# Patient Record
Sex: Female | Born: 1945 | Race: White | Hispanic: No | Marital: Married | Smoking: Never smoker
Health system: Southern US, Community
[De-identification: ages and names within clinical notes are randomized; demographics above are authoritative.]

## PROBLEM LIST (undated history)

## (undated) DIAGNOSIS — E119 Type 2 diabetes mellitus without complications: Secondary | ICD-10-CM

## (undated) DIAGNOSIS — E039 Hypothyroidism, unspecified: Secondary | ICD-10-CM

## (undated) DIAGNOSIS — I10 Essential (primary) hypertension: Secondary | ICD-10-CM

## (undated) HISTORY — PX: BREAST BIOPSY: SHX20

## (undated) HISTORY — DX: Essential (primary) hypertension: I10

## (undated) HISTORY — DX: Type 2 diabetes mellitus without complications: E11.9

## (undated) HISTORY — DX: Hypothyroidism, unspecified: E03.9

---

## 2002-07-22 ENCOUNTER — Ambulatory Visit (HOSPITAL_COMMUNITY): Admission: RE | Admit: 2002-07-22 | Discharge: 2002-07-22 | Payer: Self-pay | Admitting: Obstetrics and Gynecology

## 2002-07-22 ENCOUNTER — Encounter: Payer: Self-pay | Admitting: Obstetrics and Gynecology

## 2002-08-05 ENCOUNTER — Encounter: Payer: Self-pay | Admitting: Obstetrics and Gynecology

## 2002-08-05 ENCOUNTER — Ambulatory Visit (HOSPITAL_COMMUNITY): Admission: RE | Admit: 2002-08-05 | Discharge: 2002-08-05 | Payer: Self-pay | Admitting: Obstetrics and Gynecology

## 2003-04-16 ENCOUNTER — Encounter: Payer: Self-pay | Admitting: Family Medicine

## 2003-04-16 ENCOUNTER — Ambulatory Visit (HOSPITAL_COMMUNITY): Admission: RE | Admit: 2003-04-16 | Discharge: 2003-04-16 | Payer: Self-pay | Admitting: Family Medicine

## 2003-09-21 ENCOUNTER — Ambulatory Visit (HOSPITAL_COMMUNITY): Admission: RE | Admit: 2003-09-21 | Discharge: 2003-09-21 | Payer: Self-pay | Admitting: Family Medicine

## 2004-09-29 ENCOUNTER — Ambulatory Visit (HOSPITAL_COMMUNITY): Admission: RE | Admit: 2004-09-29 | Discharge: 2004-09-29 | Payer: Self-pay | Admitting: Family Medicine

## 2005-10-02 ENCOUNTER — Ambulatory Visit (HOSPITAL_COMMUNITY): Admission: RE | Admit: 2005-10-02 | Discharge: 2005-10-02 | Payer: Self-pay | Admitting: *Deleted

## 2006-09-05 ENCOUNTER — Ambulatory Visit (HOSPITAL_COMMUNITY): Payer: Self-pay | Admitting: Psychology

## 2006-10-24 ENCOUNTER — Ambulatory Visit (HOSPITAL_COMMUNITY): Admission: RE | Admit: 2006-10-24 | Discharge: 2006-10-24 | Payer: Self-pay | Admitting: Unknown Physician Specialty

## 2007-04-22 ENCOUNTER — Ambulatory Visit (HOSPITAL_COMMUNITY): Admission: RE | Admit: 2007-04-22 | Discharge: 2007-04-22 | Payer: Self-pay | Admitting: Unknown Physician Specialty

## 2007-11-11 ENCOUNTER — Ambulatory Visit (HOSPITAL_COMMUNITY): Admission: RE | Admit: 2007-11-11 | Discharge: 2007-11-11 | Payer: Self-pay | Admitting: Family Medicine

## 2008-02-18 ENCOUNTER — Other Ambulatory Visit: Admission: RE | Admit: 2008-02-18 | Discharge: 2008-02-18 | Payer: Self-pay | Admitting: Gynecology

## 2009-04-06 ENCOUNTER — Ambulatory Visit (HOSPITAL_COMMUNITY): Admission: RE | Admit: 2009-04-06 | Discharge: 2009-04-06 | Payer: Self-pay | Admitting: Family Medicine

## 2010-07-11 ENCOUNTER — Encounter: Payer: Self-pay | Admitting: Cardiology

## 2010-08-04 ENCOUNTER — Ambulatory Visit: Payer: Self-pay | Admitting: Cardiology

## 2010-08-04 DIAGNOSIS — E782 Mixed hyperlipidemia: Secondary | ICD-10-CM | POA: Insufficient documentation

## 2010-08-04 DIAGNOSIS — R0602 Shortness of breath: Secondary | ICD-10-CM | POA: Insufficient documentation

## 2010-08-04 DIAGNOSIS — E785 Hyperlipidemia, unspecified: Secondary | ICD-10-CM | POA: Insufficient documentation

## 2010-08-23 ENCOUNTER — Ambulatory Visit: Payer: Self-pay

## 2010-08-23 ENCOUNTER — Ambulatory Visit: Payer: Self-pay | Admitting: Cardiology

## 2010-08-23 ENCOUNTER — Ambulatory Visit: Payer: Self-pay | Admitting: Cardiovascular Disease

## 2010-08-23 ENCOUNTER — Ambulatory Visit (HOSPITAL_COMMUNITY): Admission: RE | Admit: 2010-08-23 | Discharge: 2010-08-23 | Payer: Self-pay | Admitting: Cardiology

## 2010-08-23 ENCOUNTER — Encounter: Payer: Self-pay | Admitting: Cardiology

## 2010-09-08 ENCOUNTER — Telehealth: Payer: Self-pay | Admitting: Cardiology

## 2010-09-21 ENCOUNTER — Ambulatory Visit: Payer: Self-pay | Admitting: Cardiology

## 2010-09-28 ENCOUNTER — Telehealth: Payer: Self-pay | Admitting: Cardiology

## 2010-09-28 LAB — CONVERTED CEMR LAB
BUN: 22 mg/dL (ref 6–23)
CO2: 28 meq/L (ref 19–32)
Chloride: 103 meq/L (ref 96–112)
Glucose, Bld: 117 mg/dL — ABNORMAL HIGH (ref 70–99)
Potassium: 4.1 meq/L (ref 3.5–5.3)

## 2010-10-11 ENCOUNTER — Ambulatory Visit: Payer: Self-pay | Admitting: Cardiology

## 2010-10-18 ENCOUNTER — Ambulatory Visit: Payer: Self-pay

## 2010-11-29 NOTE — Progress Notes (Signed)
Summary: B/P readings--09/28/10  Phone Note Outgoing Call   Call placed by: Katina Dung, RN, BSN,  September 28, 2010 1:11 PM Call placed to: Patient Summary of Call: B/P readings  Follow-up for Phone Call        I called pt to get B/P readings---B/P readings 120/70, 131/65, 137/72, 143/71, 141/70 --pt states she feels B/P is better --overall she feels good--I will forward to Dr Shirlee Latch for review     Appended Document: B/P readings--09/28/10 BP good  Appended Document: B/P readings--09/28/10 LMTCB   Appended Document: B/P readings--09/28/10 discussed with pt by telephone

## 2010-11-29 NOTE — Letter (Signed)
Summary: [facility]  [facility]   Imported By: Marylou Mccoy 08/17/2010 13:16:13  _____________________________________________________________________  External Attachment:    Type:   Image     Comment:   External Document

## 2010-11-29 NOTE — Progress Notes (Signed)
Summary: B/P readings  Phone Note Outgoing Call   Call placed by: Katina Dung, RN, BSN,  September 08, 2010 11:54 AM Call placed to: Patient Summary of Call: call and get B/P readings  Follow-up for Phone Call        talked with pt about recent B/P readings---08/28/10 150/85   08/29/10 164/89   08/31/10 154/85  147/78  09/01/10 163/87   09/02/10 154/87    09/03/10 153/84  153/79  09/05/10  164/94  148/84  pt without complaints--she has lost 5 pounds since OV 08/23/10--I will forward to Dr Shirlee Latch for review           Appended Document: B/P readings BP is too high.  Chlorthalidone 12.5 mg daily + KCl 10 mEq daily with followup BMET and BP check in 2 wks.   Appended Document: B/P readings LMTCB   Appended Document: B/P readings I talked with pt-she verbalized understanding and will return for [facility] 09/21/10   Clinical Lists Changes  Medications: Added new medication of CHLORTHALIDONE 25 MG TABS (CHLORTHALIDONE) one half tablet daily - Signed Added new medication of KLOR-CON M10 10 MEQ CR-TABS (POTASSIUM CHLORIDE CRYS CR) one daily - Signed Rx of CHLORTHALIDONE 25 MG TABS (CHLORTHALIDONE) one half tablet daily;  #15 x 6;  Signed;  Entered by: Katina Dung, RN, BSN;  Authorized by: Marca Ancona, MD;  Method used: Electronically to [facility]  [id number] [address] [place], [place], [place]  [address], Ph: [phone] or [phone], Fax: [phone] Rx of KLOR-CON M10 10 MEQ CR-TABS (POTASSIUM CHLORIDE CRYS CR) one daily;  #30 x 6;  Signed;  Entered by: Katina Dung, RN, BSN;  Authorized by: Marca Ancona, MD;  Method used: Electronically to [facility]  [address] [address] [place], [place], [place]  [address], Ph: [phone] or [phone], Fax: [phone] Observations: Added new observation of MEDRECON: current updated (09/08/2010 13:32)    Prescriptions: KLOR-CON M10 10 MEQ CR-TABS (POTASSIUM CHLORIDE CRYS CR) one daily  #30 x 6   Entered by:    Katina Dung, RN, BSN   Authorized by:   Marca Ancona, MD   Signed by:   Katina Dung, RN, BSN on 09/08/2010   Method used:   [facility]ically to        [facility]  [address] [facility] (retail)       [address]       [place], [place]  [address]       Ph: [phone] or [phone]       Fax: [phone]   RxID:   [id number] CHLORTHALIDONE 25 MG TABS (CHLORTHALIDONE) one half tablet daily  #15 x 6   Entered by:   Katina Dung, RN, BSN   Authorized by:   Marca Ancona, MD   Signed by:   Katina Dung, RN, BSN on 09/08/2010   Method used:   [facility]        [facility]  [address] [id number] (retail[facility]       [address]       [place], [place]  [address]       Ph: [phone] or [phone]       Fax: [phone]   RxID:   [id number]     Current Medications (verified): 1)  Synthroid 50 Mcg Tabs (Levothyroxine Sodium) .... Once Daily 2)  Cymbalta 60 Mg Cpep (Duloxetine Hcl) .... Once Daily, Pt Unsure of Dose 3)  Lipitor 20 Mg Tabs (Atorvastatin Calcium) .... One Daily 4)  Naproxen Sodium 220 Mg Tabs (Naproxen Sodium) .... Once Daily, Dose Unkown 5)  Prilosec 20 Mg Cpdr (Omeprazole) .... Once Daily 6)  Chlorthalidone 25 Mg Tabs (Chlorthalidone) .... One Half Tablet Daily 7)  Klor-Con M10 10 Meq Cr-Tabs (Potassium Chloride Crys Cr) .... One Daily  Allergies: No Known Drug Allergies

## 2010-11-29 NOTE — Assessment & Plan Note (Signed)
Summary: np6/ chf / doe/ left extermites edema/ hypercholestemia/ bcbs...   Visit Type:  new pt Primary Provider:  Dr Freddrick March. Ross  CC:  tired all the time and swelling.  History of Present Illness: 65 yo with history of hyperlipidemia, depression, and subclinical hypothyroidism presents for evaluation of fatigue and dyspnea.  Patient reports generalized lack of energy and a profound sense of fatigue.  She does not want to get out to do anything and feels exerting herself is "not worth the effort."  She is especially affected by the heat.  She thinks that she has gained 40 lbs in a year.  She says that her thyroid was recently checked (on replacement) and that the indices were "ok."  Her mood is depressed and she says that Cymbalta has not helped much.   Patient additionally reports shortness of breath when carrying a load of laundary up steps or other more heavy forms of exertion.  This has been present for a couple of years but seems to be getting a bit worse.  She can, however, walk on flat ground without significant difficulty for up to 2 miles.  No chest pain or tightness.  She is not a smoker.  She is on pravastatin for lipids but LDL is still quite high.    ECG: NSR, normal  Labs (9/11): HCT 37.2, K 4.9, creatinine 0.84, LDL 169, HDL 60  Current Medications (verified): 1)  Synthroid 50 Mcg Tabs (Levothyroxine Sodium) .... Once Daily 2)  Cymbalta 60 Mg Cpep (Duloxetine Hcl) .... Once Daily, Pt Unsure of Dose 3)  Pravastatin Sodium 80 Mg Tabs (Pravastatin Sodium) .... Once Daily 4)  Naproxen Sodium 220 Mg Tabs (Naproxen Sodium) .... Once Daily, Dose Unkown 5)  Prilosec 20 Mg Cpdr (Omeprazole) .... Once Daily  Allergies (verified): No Known Drug Allergies  Past History:  Past Medical History: 1. hypothyroidism-subclinical 2. Depression 3. Hypercholesterolemia 4. GERD  Family History: Father with CHF at 52 in setting of metastatic cancer Grandmother on digitalis, unsure  why Siblings without significant cardiac disease  Social History: Nonsmoker.  Rare ETOH.   Married, not working Originally from [place], moved here for [employer] work  Review of Systems       All systems reviewed and negative except as per HPI.   Vital Signs:  Patient profile:   65 year old female Height:      64 inches Weight:      182.75 pounds BMI:     31.48 Pulse rate:   83 / minute BP sitting:   120 / 70  (left arm) Cuff size:   regular  Vitals Entered By: Caralee Ates CMA (August 04, 2010 10:41 AM)  Physical Exam  General:  Well developed, well nourished, in no acute distress. Mildly obese. Head:  normocephalic and atraumatic Nose:  no deformity, discharge, inflammation, or lesions Mouth:  Teeth, gums and palate normal. Oral mucosa normal. Neck:  Neck supple, no JVD. No masses, thyromegaly or abnormal cervical nodes. Lungs:  Clear bilaterally to auscultation and percussion. Heart:  Non-displaced PMI, chest non-tender; regular rate and rhythm, S1, S2 without murmurs, rubs or gallops. Carotid upstroke normal, no bruit. Pedals normal pulses. No edema, no varicosities. Abdomen:  Bowel sounds positive; abdomen soft and non-tender without masses, organomegaly, or hernias noted. No hepatosplenomegaly. Msk:  Back normal, normal gait. Muscle strength and tone normal. Extremities:  No clubbing or cyanosis. Neurologic:  Alert and oriented x 3. Skin:  Intact without lesions or rashes. Psych:  depressed affect.     Impression & Recommendations:  Problem # 1:  SHORTNESS OF BREATH (ICD-786.05) Patient gets short of breath with moderate to heavy exertion.  She feels like this has worsened over the last year. No chest pain or tightness.  She does have profound fatigue with some mild anhedonia and depressed mood.  Main risk factor for coronary disease is hyperlipidemia.  I suspect that the main causes of her symptoms are weight gain over the last year with some resulting decreased  exercise tolerance and depression.  I will do a basic evaluation to make sure that her heart is structurally normal.   - Normal baseline ECG, arrange for ETT.  - Echocardiogram to ensure heart is structurally normal - Cymbalta does not seem to be helping her depression, ? med change or referral to psychiatrist (per primary).   Problem # 2:  HYPERLIPIDEMIA-MIXED (ICD-272.4) Good HDL but LDL is still quite elevated at 160.  Patient states that she was taking pravastatin 80 mg daily when this lab was drawn.  Will change her over to atorvastatin 20 mg daily.  This will be generic in a couple of months. lipids/LFTs in 2 months with change.   Other Orders: Treadmill (Treadmill) Echocardiogram (Echo)  Patient Instructions: 1)  Your physician has recommended you make the following change in your medication:  2)  Stop Pravastatin.   3)  Start Lipitor 20mg  daily. 4)  Your physician has requested that you have an echocardiogram.  Echocardiography is a painless test that uses sound waves to create images of your heart. It provides your doctor with information about the size and shape of your heart and how well your heart's chambers and valves are working.  This procedure takes approximately one hour. There are no restrictions for this procedure.  YOU CAN HAVE THIS THE SAME DAY AS THE TREADMILL BUT BE SURE YOU HAVE THE ECHO FIRST SO DR MCLEAN WILL HAVE THESE RESULTS BEFORE YOU HAVE THE TREADMILL. 5)  Your physician has requested that you have an exercise tolerance test.  For further information please visit [email or web address]  Please also follow instruction sheet, as given. 6)  Your physician recommends that you return for a FASTING lipid profile/liver profile in 2 months--786.09 Prescriptions: LIPITOR 20 MG TABS (ATORVASTATIN CALCIUM) one daily  #30 x 3   Entered by:   Katina Dung, RN, BSN   Authorized by:   Marca Ancona, MD   Signed by:   Katina Dung, RN, BSN on 08/04/2010   Method used:    [facility]  [address] [id number]       [address]       [place], [place]  [address]       Ph: [phone] or [phone]       Fax: [phone]   RxID:   [id number]

## 2013-09-16 ENCOUNTER — Encounter (INDEPENDENT_AMBULATORY_CARE_PROVIDER_SITE_OTHER): Payer: Self-pay | Admitting: *Deleted

## 2014-03-04 ENCOUNTER — Encounter (INDEPENDENT_AMBULATORY_CARE_PROVIDER_SITE_OTHER): Payer: Self-pay | Admitting: *Deleted

## 2014-09-22 ENCOUNTER — Encounter (INDEPENDENT_AMBULATORY_CARE_PROVIDER_SITE_OTHER): Payer: Self-pay | Admitting: *Deleted

## 2014-10-14 ENCOUNTER — Ambulatory Visit (INDEPENDENT_AMBULATORY_CARE_PROVIDER_SITE_OTHER): Payer: Self-pay | Admitting: Internal Medicine

## 2014-11-04 ENCOUNTER — Encounter (INDEPENDENT_AMBULATORY_CARE_PROVIDER_SITE_OTHER): Payer: Self-pay | Admitting: Internal Medicine

## 2014-11-04 ENCOUNTER — Telehealth (INDEPENDENT_AMBULATORY_CARE_PROVIDER_SITE_OTHER): Payer: Self-pay | Admitting: *Deleted

## 2014-11-04 ENCOUNTER — Other Ambulatory Visit (INDEPENDENT_AMBULATORY_CARE_PROVIDER_SITE_OTHER): Payer: Self-pay | Admitting: *Deleted

## 2014-11-04 ENCOUNTER — Ambulatory Visit (INDEPENDENT_AMBULATORY_CARE_PROVIDER_SITE_OTHER): Payer: Managed Care, Other (non HMO) | Admitting: Internal Medicine

## 2014-11-04 VITALS — BP 118/72 | HR 76 | Temp 98.1°F | Ht 64.0 in | Wt 137.4 lb

## 2014-11-04 DIAGNOSIS — D649 Anemia, unspecified: Secondary | ICD-10-CM

## 2014-11-04 DIAGNOSIS — R195 Other fecal abnormalities: Secondary | ICD-10-CM

## 2014-11-04 DIAGNOSIS — Z1211 Encounter for screening for malignant neoplasm of colon: Secondary | ICD-10-CM

## 2014-11-04 DIAGNOSIS — D509 Iron deficiency anemia, unspecified: Secondary | ICD-10-CM

## 2014-11-04 LAB — IRON AND TIBC
%SAT: 10 % — ABNORMAL LOW (ref 20–55)
IRON: 50 ug/dL (ref 42–145)
TIBC: 508 ug/dL — AB (ref 250–470)
UIBC: 458 ug/dL — ABNORMAL HIGH (ref 125–400)

## 2014-11-04 LAB — FERRITIN: FERRITIN: 8 ng/mL — AB (ref 10–291)

## 2014-11-04 LAB — VITAMIN B12: Vitamin B-12: 599 pg/mL (ref 211–911)

## 2014-11-04 LAB — FOLATE: Folate: 16 ng/mL

## 2014-11-04 NOTE — Patient Instructions (Signed)
Colonoscopy.  The risks and benefits such as perforation, bleeding, and infection were reviewed with the patient and is agreeable. 

## 2014-11-04 NOTE — Telephone Encounter (Signed)
Patient needs movi prep 

## 2014-11-04 NOTE — Progress Notes (Addendum)
Subjective:    Patient ID: Carla Nguyen, female    DOB: 03/23/1946, 69 y.o.   MRN: [id number]  HPI Referred to our office by Dr Loney Hering for +FOB card. She tells me she is due for a colonoscopy. Last colonoscopy was by Dr. Cleotis Nipper in 2006 and was normal. There were no polyps.    Recent blood work noted Hemoglobin was 10.7.  She tells me she donates blood and platelets quarterly. Last donation was in September. Appetite is good. She has lost about 30 pounds over a year. The weight loss was intentional due to new diagnosis of DM2. No dysphagia. No abdominal pain. She usually has a BM about once every day.  She denies seeing any melena or BRRB. 09/07/2014 H and H 10.7 and 34.2, MCV 81.3, platelet ct 452, Albumin 4.1, ALP 62, AST 24, total bili 0.3. ALT 18.  11/17/2004 Colonoscopy Dr. Cleotis Nipper: External inspection of the anus revealed a small hemorrhoidal tag. Digital exam of the rectum revealed no rectal mass, no blood on the glove. The colonoscope passed all the way to the cecum. No polyps or other neoplasm seen. No inflammation, no diverticular disease. Patient had some small internal hemorrhoids.  Review of Systems Past Medical History  Diagnosis Date  . Hypertension   . Diabetes     x 1 yr  . Hypothyroid     Past Surgical History  Procedure Laterality Date  . Breast biopsy      rt normal    No Known Allergies  No current outpatient prescriptions on file prior to visit.   No current facility-administered medications on file prior to visit.    Past Medical History  Diagnosis Date  . Hypertension   . Diabetes     x 1 yr  . Hypothyroid     Past Surgical History  Procedure Laterality Date  . Breast biopsy      rt normal   Current Outpatient Prescriptions  Medication Sig Dispense Refill  . amLODipine (NORVASC) 5 MG tablet Take 5 mg by mouth daily.    Marland Kitchen atorvastatin (LIPITOR) 40 MG tablet Take 40 mg by mouth daily.    . chlorthalidone (HYGROTON) 25 MG tablet Take 25  mg by mouth daily.    . DULoxetine (CYMBALTA) 60 MG capsule Take 60 mg by mouth daily.    Marland Kitchen levothyroxine (SYNTHROID, LEVOTHROID) 50 MCG tablet Take 50 mcg by mouth daily before breakfast.    . metFORMIN (GLUCOPHAGE) 1000 MG tablet Take 1,000 mg by mouth daily with breakfast.     No current facility-administered medications for this visit.     No Known Allergies  No current outpatient prescriptions on file prior to visit.   No current facility-administered medications on file prior to visit.   No current outpatient prescriptions on file prior to visit.   No current facility-administered medications on file prior to visit.   .med      Objective:   Physical Exam  Filed Vitals:   11/04/14 1117  Height:  (1.626 m)  Weight: 137 lb 6.4 oz (62.324 kg)   Alert and oriented. Skin warm and dry. Oral mucosa is moist.   . Sclera anicteric, conjunctivae is pink. Thyroid not enlarged. No cervical lymphadenopathy. Lungs clear. Heart regular rate and rhythm.  Abdomen is soft. Bowel sounds are positive. No hepatomegaly. No abdominal masses felt. No tenderness.  No edema to lower extremities.           Assessment & Plan:  Anemia. +  FOB card. Colonic carcinoma needs to be ruled out. Colonoscopy.The risks and benefits such as perforation, bleeding, and infection were reviewed with the patient and is agreeable. [facility] Will also get an anemia profile.

## 2014-11-06 ENCOUNTER — Encounter (INDEPENDENT_AMBULATORY_CARE_PROVIDER_SITE_OTHER): Payer: Self-pay

## 2014-11-06 MED ORDER — PEG-KCL-NACL-NASULF-NA ASC-C 100 G PO SOLR: 1.0000 | Freq: Once | ORAL | Status: DC

## 2014-11-13 ENCOUNTER — Encounter (HOSPITAL_COMMUNITY)
Admission: RE | Disposition: A | Discharge status: Home / Self Care | Payer: Self-pay | Source: Ambulatory Visit | Attending: Internal Medicine

## 2014-11-13 ENCOUNTER — Encounter (HOSPITAL_COMMUNITY): Payer: Self-pay | Admitting: *Deleted

## 2014-11-13 ENCOUNTER — Ambulatory Visit (HOSPITAL_COMMUNITY)
Admission: RE | Admit: 2014-11-13 | Discharge: 2014-11-13 | Disposition: A | Discharge status: Home / Self Care | Payer: Managed Care, Other (non HMO) | Source: Ambulatory Visit | Attending: Internal Medicine | Admitting: Internal Medicine

## 2014-11-13 DIAGNOSIS — E039 Hypothyroidism, unspecified: Secondary | ICD-10-CM | POA: Diagnosis not present

## 2014-11-13 DIAGNOSIS — R040 Epistaxis: Secondary | ICD-10-CM | POA: Diagnosis not present

## 2014-11-13 DIAGNOSIS — K5730 Diverticulosis of large intestine without perforation or abscess without bleeding: Secondary | ICD-10-CM | POA: Insufficient documentation

## 2014-11-13 DIAGNOSIS — R195 Other fecal abnormalities: Secondary | ICD-10-CM

## 2014-11-13 DIAGNOSIS — I10 Essential (primary) hypertension: Secondary | ICD-10-CM | POA: Insufficient documentation

## 2014-11-13 DIAGNOSIS — D649 Anemia, unspecified: Secondary | ICD-10-CM | POA: Diagnosis not present

## 2014-11-13 DIAGNOSIS — K921 Melena: Secondary | ICD-10-CM | POA: Diagnosis present

## 2014-11-13 DIAGNOSIS — K644 Residual hemorrhoidal skin tags: Secondary | ICD-10-CM | POA: Insufficient documentation

## 2014-11-13 DIAGNOSIS — Q438 Other specified congenital malformations of intestine: Secondary | ICD-10-CM

## 2014-11-13 DIAGNOSIS — E119 Type 2 diabetes mellitus without complications: Secondary | ICD-10-CM | POA: Insufficient documentation

## 2014-11-13 HISTORY — PX: COLONOSCOPY: SHX5424

## 2014-11-13 LAB — GLUCOSE, CAPILLARY
GLUCOSE-CAPILLARY: 60 mg/dL — AB (ref 70–99)
Glucose-Capillary: 74 mg/dL (ref 70–99)
Glucose-Capillary: 81 mg/dL (ref 70–99)

## 2014-11-13 SURGERY — COLONOSCOPY
Anesthesia: Moderate Sedation

## 2014-11-13 MED ORDER — STERILE WATER FOR IRRIGATION IR SOLN
Status: DC | PRN
  Administered 2014-11-13: 10:00:00

## 2014-11-13 MED ORDER — MEPERIDINE HCL 50 MG/ML IJ SOLN
INTRAMUSCULAR | Status: DC | PRN
  Administered 2014-11-13 (×2): 25 mg

## 2014-11-13 MED ORDER — SODIUM CHLORIDE 0.9 % IV SOLN
INTRAVENOUS | Status: DC
  Administered 2014-11-13: 09:00:00 via INTRAVENOUS

## 2014-11-13 MED ORDER — MIDAZOLAM HCL 5 MG/5ML IJ SOLN
INTRAMUSCULAR | Status: DC | PRN
  Administered 2014-11-13: 1 mg via INTRAVENOUS
  Administered 2014-11-13 (×2): 2 mg via INTRAVENOUS
  Administered 2014-11-13: 1 mg via INTRAVENOUS

## 2014-11-13 MED ORDER — MEPERIDINE HCL 50 MG/ML IJ SOLN
INTRAMUSCULAR | Status: AC
  Filled 2014-11-13: qty 1

## 2014-11-13 MED ORDER — MIDAZOLAM HCL 5 MG/5ML IJ SOLN
INTRAMUSCULAR | Status: AC
  Filled 2014-11-13: qty 10

## 2014-11-13 NOTE — H&P (Signed)
Carla Nguyen is an 69 y.o. female.   Chief Complaint: Patient is here for colonoscopy. HPI: Patient is 69-year-old Caucasian female who was recently noted have heme-positive stool. Her hemoglobin silhouettes 10.7. Patient denies abdominal pain melena or rectal bleeding. Bowels move regularly without as been her pattern for years. She does not take OTC NSAIDs. Last colonoscopy was in 2006. Family history is negative for CRC.  Past Medical History  Diagnosis Date  . Hypertension   . Diabetes     x 1 yr  . Hypothyroid     Past Surgical History  Procedure Laterality Date  . Breast biopsy      rt normal    History reviewed. No pertinent family history. Social History:  reports that she has never smoked. She does not have any smokeless tobacco history on file. She reports that she does not drink alcohol or use illicit drugs.  Allergies: No Known Allergies  Medications Prior to Admission  Medication Sig Dispense Refill  . amLODipine (NORVASC) 5 MG tablet Take 5 mg by mouth daily.    . atorvastatin (LIPITOR) 40 MG tablet Take 40 mg by mouth daily.    . chlorthalidone (HYGROTON) 25 MG tablet Take 25 mg by mouth daily.    . clobetasol ointment (TEMOVATE) 0.05 % Apply 1 application topically 2 (two) times a week.  1  . DULoxetine (CYMBALTA) 60 MG capsule Take 60 mg by mouth daily.    . ESTRACE VAGINAL 0.1 MG/GM vaginal cream Place 1 application vaginally 2 (two) times a week.  6  . levothyroxine (SYNTHROID, LEVOTHROID) 50 MCG tablet Take 50 mcg by mouth daily before breakfast.    . metFORMIN (GLUCOPHAGE) 1000 MG tablet Take 1,000 mg by mouth daily with breakfast.    . peg 3350 powder (MOVIPREP) 100 G SOLR Take 1 kit (200 g total) by mouth once. 1 kit 0  . VAGIFEM 10 MCG TABS vaginal tablet Place 1 tablet vaginally 2 (two) times a week.  11    Results for orders placed or performed during the hospital encounter of 11/13/14 (from the past 48 hour(s))  Glucose, capillary     Status: None    Collection Time: 11/13/14  9:17 AM  Result Value Ref Range   Glucose-Capillary 74 70 - 99 mg/dL   No results found.  ROS  Blood pressure 140/75, pulse 72, temperature 97.8 F (36.6 C), temperature source Oral, resp. rate 20, height 5' 4" (1.626 m), weight 132 lb (59.875 kg), SpO2 98 %. Physical Exam  Constitutional: She appears well-developed and well-nourished.  HENT:  Mouth/Throat: Oropharynx is clear and moist.  Eyes: Conjunctivae are normal. No scleral icterus.  Neck: No thyromegaly present.  Cardiovascular: Normal rate, regular rhythm and normal heart sounds.   No murmur heard. Respiratory: Effort normal and breath sounds normal.  GI: Soft. She exhibits no distension and no mass. There is no tenderness.  Musculoskeletal: She exhibits no edema.  Lymphadenopathy:    She has no cervical adenopathy.  Neurological: She is alert.  Skin: Skin is warm and dry.     Assessment/Plan Heme positive stool and anemia. Diagnostic colonoscopy.  REHMAN,NAJEEB U 11/13/2014, 9:54 AM    

## 2014-11-13 NOTE — Op Note (Signed)
COLONOSCOPY PROCEDURE REPORT  PATIENT:  Carla Nguyen  MR#:  [id number] Birthdate:  December 11, 1945, 69 y.o., female Endoscopist:  Dr. Malissa HippoNajeeb U. Rehman, MD Referred By:  Dr. Ernestine ConradKirk Bluth, MD Procedure Date: 11/13/2014  Procedure:   Colonoscopy  Indications:  Patient is 69 year old Caucasian female who was noted to have heme-positive stool and no hemoglobin of 10.7 g. She denies abdominal pain change in bowel habits or rectal bleeding. She does have history of intermittent nosebleeds. She has donated blood in the past but lately she's been noticing platelets.  Informed Consent:  The procedure and risks were reviewed with the patient and informed consent was obtained.  Medications:  Demerol 50 mg IV Versed 6 mg IV  Description of procedure:  After a digital rectal exam was performed, that colonoscope was advanced from the anus through the rectum and colon to the area of the cecum, ileocecal valve and appendiceal orifice. The cecum was deeply intubated. These structures were well-seen and photographed for the record. From the level of the cecum and ileocecal valve, the scope was slowly and cautiously withdrawn. The mucosal surfaces were carefully surveyed utilizing scope tip to flexion to facilitate fold flattening as needed. The scope was pulled down into the rectum where a thorough exam including retroflexion was performed. Please note pediatric colonoscope was exchanged with Slim scope.  Findings:   Prep excellent. Redundant tortuous colon. Few small diverticula at sigmoid colon. Normal rectal mucosa. Prominent hemorrhoids below the dentate line.   Therapeutic/Diagnostic Maneuvers Performed:   None  Complications:  None  Cecal Withdrawal Time:  6 minutes  Impression:  Examination performed to cecum. Redundant tortuous colon with few diverticula at sigmoid colon. External hemorrhoids  Recommendations:  Standard instructions given. Patient advised not to donate blood until further  notice. CBC in one month. Office will call.  REHMAN,NAJEEB U  11/13/2014 11:04 AM  CC: Dr. Ernestine ConradBLUTH, KIRK, MD & Dr. Bonnetta BarryNo ref. provider found

## 2014-11-13 NOTE — Discharge Instructions (Signed)
Resume usual medications and high fiber diet. No driving for 24 hours. CBC in one month. Office will call.   Colonoscopy, Care After These instructions give you information on caring for yourself after your procedure. Your doctor may also give you more specific instructions. Call your doctor if you have any problems or questions after your procedure. HOME CARE  Do not drive for 24 hours.  Do not sign important papers or use machinery for 24 hours.  You may shower.  You may go back to your usual activities, but go slower for the first 24 hours.  Take rest breaks often during the first 24 hours.  Walk around or use warm packs on your belly (abdomen) if you have belly cramping or gas.  Drink enough fluids to keep your pee (urine) clear or pale yellow.  Resume your normal diet. Avoid heavy or fried foods.  Avoid drinking alcohol for 24 hours or as told by your doctor.  Only take medicines as told by your doctor. If a tissue sample (biopsy) was taken during the procedure:   Do not take aspirin or blood thinners for 7 days, or as told by your doctor.  Do not drink alcohol for 7 days, or as told by your doctor.  Eat soft foods for the first 24 hours. GET HELP IF: You still have a small amount of blood in your poop (stool) 2-3 days after the procedure. GET HELP RIGHT AWAY IF:  You have more than a small amount of blood in your poop.  You see clumps of tissue (blood clots) in your poop.  Your belly is puffy (swollen).  You feel sick to your stomach (nauseous) or throw up (vomit).  You have a fever.  You have belly pain that gets worse and medicine does not help. MAKE SURE YOU:  Understand these instructions.  Will watch your condition.  Will get help right away if you are not doing well or get worse. Document Released: 11/18/2010 Document Revised: 10/21/2013 Document Reviewed: 06/23/2013 [facility] Patient Information 2015 [place], [place]. This information is not  intended to replace advice given to you by your health care provider. Make sure you discuss any questions you have with your health care provider.     High-Fiber Diet Fiber is found in fruits, vegetables, and grains. A high-fiber diet encourages the addition of more whole grains, legumes, fruits, and vegetables in your diet. The recommended amount of fiber for adult males is 38 g per day. For adult females, it is 25 g per day. Pregnant and lactating women should get 28 g of fiber per day. If you have a digestive or bowel problem, ask your caregiver for advice before adding high-fiber foods to your diet. Eat a variety of high-fiber foods instead of only a select few type of foods.  PURPOSE  To increase stool bulk.  To make bowel movements more regular to prevent constipation.  To lower cholesterol.  To prevent overeating. WHEN IS THIS DIET USED?  It may be used if you have constipation and hemorrhoids.  It may be used if you have uncomplicated diverticulosis (intestine condition) and irritable bowel syndrome.  It may be used if you need help with weight management.  It may be used if you want to add it to your diet as a protective measure against atherosclerosis, diabetes, and cancer. SOURCES OF FIBER  Whole-grain breads and cereals.  Fruits, such as apples, oranges, bananas, berries, prunes, and pears.  Vegetables, such as green peas, carrots, sweet  potatoes, beets, broccoli, cabbage, spinach, and artichokes.  Legumes, such split peas, soy, lentils.  Almonds. FIBER CONTENT IN FOODS Starches and Grains / Dietary Fiber (g)  Cheerios, 1 cup / 3 g  Corn Flakes cereal, 1 cup / 0.7 g  Rice crispy treat cereal, 1 cup / 0.3 g  Instant oatmeal (cooked),  cup / 2 g  Frosted wheat cereal, 1 cup / 5.1 g  Brown, long-grain rice (cooked), 1 cup / 3.5 g  White, long-grain rice (cooked), 1 cup / 0.6 g  Enriched macaroni (cooked), 1 cup / 2.5 g Legumes / Dietary Fiber  (g)  Baked beans (canned, plain, or vegetarian),  cup / 5.2 g  Kidney beans (canned),  cup / 6.8 g  Pinto beans (cooked),  cup / 5.5 g Breads and Crackers / Dietary Fiber (g)  Plain or honey graham crackers, 2 squares / 0.7 g  Saltine crackers, 3 squares / 0.3 g  Plain, salted pretzels, 10 pieces / 1.8 g  Whole-wheat bread, 1 slice / 1.9 g  White bread, 1 slice / 0.7 g  Raisin bread, 1 slice / 1.2 g  Plain bagel, 3 oz / 2 g  Flour tortilla, 1 oz / 0.9 g  Corn tortilla, 1 small / 1.5 g  Hamburger or hotdog bun, 1 small / 0.9 g Fruits / Dietary Fiber (g)  Apple with skin, 1 medium / 4.4 g  Sweetened applesauce,  cup / 1.5 g  Banana,  medium / 1.5 g  Grapes, 10 grapes / 0.4 g  Orange, 1 small / 2.3 g  Raisin, 1.5 oz / 1.6 g  Melon, 1 cup / 1.4 g Vegetables / Dietary Fiber (g)  Green beans (canned),  cup / 1.3 g  Carrots (cooked),  cup / 2.3 g  Broccoli (cooked),  cup / 2.8 g  Peas (cooked),  cup / 4.4 g  Mashed potatoes,  cup / 1.6 g  Lettuce, 1 cup / 0.5 g  Corn (canned),  cup / 1.6 g  Tomato,  cup / 1.1 g Document Released: 10/16/2005 Document Revised: 04/16/2012 Document Reviewed: 01/18/2012 [employer] Patient Information 2015 [employer]. This information is not intended to replace advice given to you by your health care provider. Make sure you discuss any questions you have with your health care provider.

## 2014-11-16 ENCOUNTER — Encounter (HOSPITAL_COMMUNITY): Payer: Self-pay | Admitting: Internal Medicine

## 2014-11-16 ENCOUNTER — Telehealth (INDEPENDENT_AMBULATORY_CARE_PROVIDER_SITE_OTHER): Payer: Self-pay | Admitting: *Deleted

## 2014-11-16 DIAGNOSIS — D509 Iron deficiency anemia, unspecified: Secondary | ICD-10-CM

## 2014-11-16 NOTE — Telephone Encounter (Signed)
Per Dr.Rehman the patient will need to have labs drawn in 1 month. 

## 2014-11-18 ENCOUNTER — Encounter (INDEPENDENT_AMBULATORY_CARE_PROVIDER_SITE_OTHER): Payer: Self-pay | Admitting: *Deleted

## 2014-11-18 ENCOUNTER — Other Ambulatory Visit (INDEPENDENT_AMBULATORY_CARE_PROVIDER_SITE_OTHER): Payer: Self-pay | Admitting: *Deleted

## 2014-11-18 DIAGNOSIS — D509 Iron deficiency anemia, unspecified: Secondary | ICD-10-CM

## 2014-12-18 LAB — CBC
HEMATOCRIT: 35 % — AB (ref 36.0–46.0)
Hemoglobin: 11.2 g/dL — ABNORMAL LOW (ref 12.0–15.0)
MCH: 24.6 pg — ABNORMAL LOW (ref 26.0–34.0)
MCHC: 32 g/dL (ref 30.0–36.0)
MCV: 76.9 fL — ABNORMAL LOW (ref 78.0–100.0)
MPV: 9.4 fL (ref 8.6–12.4)
Platelets: 483 10*3/uL — ABNORMAL HIGH (ref 150–400)
RBC: 4.55 MIL/uL (ref 3.87–5.11)
RDW: 16.9 % — ABNORMAL HIGH (ref 11.5–15.5)
WBC: 5.4 10*3/uL (ref 4.0–10.5)

## 2014-12-21 ENCOUNTER — Telehealth (INDEPENDENT_AMBULATORY_CARE_PROVIDER_SITE_OTHER): Payer: Self-pay | Admitting: *Deleted

## 2014-12-21 ENCOUNTER — Encounter (INDEPENDENT_AMBULATORY_CARE_PROVIDER_SITE_OTHER): Payer: Self-pay | Admitting: *Deleted

## 2014-12-21 DIAGNOSIS — D509 Iron deficiency anemia, unspecified: Secondary | ICD-10-CM

## 2014-12-21 NOTE — Telephone Encounter (Signed)
Per Dr.Rehman the patient will need to have labs drawn in 1 month. 

## 2018-05-28 DIAGNOSIS — Z299 Encounter for prophylactic measures, unspecified: Secondary | ICD-10-CM | POA: Diagnosis not present

## 2018-05-28 DIAGNOSIS — J029 Acute pharyngitis, unspecified: Secondary | ICD-10-CM | POA: Diagnosis not present

## 2018-05-28 DIAGNOSIS — Z6825 Body mass index (BMI) 25.0-25.9, adult: Secondary | ICD-10-CM | POA: Diagnosis not present

## 2018-05-28 DIAGNOSIS — E039 Hypothyroidism, unspecified: Secondary | ICD-10-CM | POA: Diagnosis not present

## 2018-05-28 DIAGNOSIS — I10 Essential (primary) hypertension: Secondary | ICD-10-CM | POA: Diagnosis not present

## 2018-05-28 DIAGNOSIS — E1165 Type 2 diabetes mellitus with hyperglycemia: Secondary | ICD-10-CM | POA: Diagnosis not present

## 2018-07-09 DIAGNOSIS — H25813 Combined forms of age-related cataract, bilateral: Secondary | ICD-10-CM | POA: Diagnosis not present

## 2018-07-09 DIAGNOSIS — H5213 Myopia, bilateral: Secondary | ICD-10-CM | POA: Diagnosis not present

## 2018-07-09 DIAGNOSIS — E119 Type 2 diabetes mellitus without complications: Secondary | ICD-10-CM | POA: Diagnosis not present

## 2018-07-09 DIAGNOSIS — Z7984 Long term (current) use of oral hypoglycemic drugs: Secondary | ICD-10-CM | POA: Diagnosis not present

## 2018-08-06 DIAGNOSIS — Z7984 Long term (current) use of oral hypoglycemic drugs: Secondary | ICD-10-CM | POA: Diagnosis not present

## 2018-08-06 DIAGNOSIS — Z6829 Body mass index (BMI) 29.0-29.9, adult: Secondary | ICD-10-CM | POA: Diagnosis not present

## 2018-08-06 DIAGNOSIS — E039 Hypothyroidism, unspecified: Secondary | ICD-10-CM | POA: Diagnosis not present

## 2018-08-06 DIAGNOSIS — E1136 Type 2 diabetes mellitus with diabetic cataract: Secondary | ICD-10-CM | POA: Diagnosis not present

## 2018-08-06 DIAGNOSIS — E663 Overweight: Secondary | ICD-10-CM | POA: Diagnosis not present

## 2018-08-06 DIAGNOSIS — F1021 Alcohol dependence, in remission: Secondary | ICD-10-CM | POA: Diagnosis not present

## 2018-08-06 DIAGNOSIS — I10 Essential (primary) hypertension: Secondary | ICD-10-CM | POA: Diagnosis not present

## 2018-08-06 DIAGNOSIS — F330 Major depressive disorder, recurrent, mild: Secondary | ICD-10-CM | POA: Diagnosis not present

## 2018-08-06 DIAGNOSIS — E785 Hyperlipidemia, unspecified: Secondary | ICD-10-CM | POA: Diagnosis not present

## 2018-09-03 DIAGNOSIS — Z299 Encounter for prophylactic measures, unspecified: Secondary | ICD-10-CM | POA: Diagnosis not present

## 2018-09-03 DIAGNOSIS — Z6825 Body mass index (BMI) 25.0-25.9, adult: Secondary | ICD-10-CM | POA: Diagnosis not present

## 2018-09-03 DIAGNOSIS — E1165 Type 2 diabetes mellitus with hyperglycemia: Secondary | ICD-10-CM | POA: Diagnosis not present

## 2018-09-03 DIAGNOSIS — I10 Essential (primary) hypertension: Secondary | ICD-10-CM | POA: Diagnosis not present

## 2018-10-15 DIAGNOSIS — H25013 Cortical age-related cataract, bilateral: Secondary | ICD-10-CM | POA: Diagnosis not present

## 2018-10-15 DIAGNOSIS — H40013 Open angle with borderline findings, low risk, bilateral: Secondary | ICD-10-CM | POA: Diagnosis not present

## 2018-10-15 DIAGNOSIS — H2511 Age-related nuclear cataract, right eye: Secondary | ICD-10-CM | POA: Diagnosis not present

## 2018-10-15 DIAGNOSIS — H25043 Posterior subcapsular polar age-related cataract, bilateral: Secondary | ICD-10-CM | POA: Diagnosis not present

## 2018-10-15 DIAGNOSIS — H2513 Age-related nuclear cataract, bilateral: Secondary | ICD-10-CM | POA: Diagnosis not present

## 2018-10-15 DIAGNOSIS — H18413 Arcus senilis, bilateral: Secondary | ICD-10-CM | POA: Diagnosis not present

## 2018-10-17 DIAGNOSIS — Z1331 Encounter for screening for depression: Secondary | ICD-10-CM | POA: Diagnosis not present

## 2018-10-17 DIAGNOSIS — Z7189 Other specified counseling: Secondary | ICD-10-CM | POA: Diagnosis not present

## 2018-10-17 DIAGNOSIS — Z1339 Encounter for screening examination for other mental health and behavioral disorders: Secondary | ICD-10-CM | POA: Diagnosis not present

## 2018-10-17 DIAGNOSIS — Z79899 Other long term (current) drug therapy: Secondary | ICD-10-CM | POA: Diagnosis not present

## 2018-10-17 DIAGNOSIS — Z1211 Encounter for screening for malignant neoplasm of colon: Secondary | ICD-10-CM | POA: Diagnosis not present

## 2018-10-17 DIAGNOSIS — E039 Hypothyroidism, unspecified: Secondary | ICD-10-CM | POA: Diagnosis not present

## 2018-10-17 DIAGNOSIS — Z6825 Body mass index (BMI) 25.0-25.9, adult: Secondary | ICD-10-CM | POA: Diagnosis not present

## 2018-10-17 DIAGNOSIS — Z0000 Encounter for general adult medical examination without abnormal findings: Secondary | ICD-10-CM | POA: Diagnosis not present

## 2018-10-17 DIAGNOSIS — R5383 Other fatigue: Secondary | ICD-10-CM | POA: Diagnosis not present

## 2018-10-17 DIAGNOSIS — E7800 Pure hypercholesterolemia, unspecified: Secondary | ICD-10-CM | POA: Diagnosis not present

## 2018-10-17 DIAGNOSIS — E559 Vitamin D deficiency, unspecified: Secondary | ICD-10-CM | POA: Diagnosis not present

## 2018-10-17 DIAGNOSIS — Z299 Encounter for prophylactic measures, unspecified: Secondary | ICD-10-CM | POA: Diagnosis not present

## 2018-12-09 DIAGNOSIS — H2511 Age-related nuclear cataract, right eye: Secondary | ICD-10-CM | POA: Diagnosis not present

## 2018-12-10 DIAGNOSIS — H2512 Age-related nuclear cataract, left eye: Secondary | ICD-10-CM | POA: Diagnosis not present

## 2018-12-12 DIAGNOSIS — Z299 Encounter for prophylactic measures, unspecified: Secondary | ICD-10-CM | POA: Diagnosis not present

## 2018-12-12 DIAGNOSIS — E039 Hypothyroidism, unspecified: Secondary | ICD-10-CM | POA: Diagnosis not present

## 2018-12-12 DIAGNOSIS — E1165 Type 2 diabetes mellitus with hyperglycemia: Secondary | ICD-10-CM | POA: Diagnosis not present

## 2018-12-12 DIAGNOSIS — M25472 Effusion, left ankle: Secondary | ICD-10-CM | POA: Diagnosis not present

## 2018-12-12 DIAGNOSIS — I10 Essential (primary) hypertension: Secondary | ICD-10-CM | POA: Diagnosis not present

## 2018-12-12 DIAGNOSIS — Z6826 Body mass index (BMI) 26.0-26.9, adult: Secondary | ICD-10-CM | POA: Diagnosis not present

## 2018-12-30 DIAGNOSIS — H2512 Age-related nuclear cataract, left eye: Secondary | ICD-10-CM | POA: Diagnosis not present

## 2019-01-03 DIAGNOSIS — E2839 Other primary ovarian failure: Secondary | ICD-10-CM | POA: Diagnosis not present

## 2019-03-27 DIAGNOSIS — Z299 Encounter for prophylactic measures, unspecified: Secondary | ICD-10-CM | POA: Diagnosis not present

## 2019-03-27 DIAGNOSIS — I10 Essential (primary) hypertension: Secondary | ICD-10-CM | POA: Diagnosis not present

## 2019-03-27 DIAGNOSIS — E039 Hypothyroidism, unspecified: Secondary | ICD-10-CM | POA: Diagnosis not present

## 2019-03-27 DIAGNOSIS — Z6826 Body mass index (BMI) 26.0-26.9, adult: Secondary | ICD-10-CM | POA: Diagnosis not present

## 2019-03-27 DIAGNOSIS — R5383 Other fatigue: Secondary | ICD-10-CM | POA: Diagnosis not present

## 2019-03-27 DIAGNOSIS — E1165 Type 2 diabetes mellitus with hyperglycemia: Secondary | ICD-10-CM | POA: Diagnosis not present

## 2019-04-10 DIAGNOSIS — H52223 Regular astigmatism, bilateral: Secondary | ICD-10-CM | POA: Diagnosis not present

## 2019-04-14 ENCOUNTER — Other Ambulatory Visit: Payer: Self-pay

## 2019-04-14 ENCOUNTER — Encounter (INDEPENDENT_AMBULATORY_CARE_PROVIDER_SITE_OTHER): Payer: Medicare HMO | Admitting: Ophthalmology

## 2019-04-14 DIAGNOSIS — I10 Essential (primary) hypertension: Secondary | ICD-10-CM

## 2019-04-14 DIAGNOSIS — E113311 Type 2 diabetes mellitus with moderate nonproliferative diabetic retinopathy with macular edema, right eye: Secondary | ICD-10-CM

## 2019-04-14 DIAGNOSIS — E113392 Type 2 diabetes mellitus with moderate nonproliferative diabetic retinopathy without macular edema, left eye: Secondary | ICD-10-CM

## 2019-04-14 DIAGNOSIS — H35033 Hypertensive retinopathy, bilateral: Secondary | ICD-10-CM | POA: Diagnosis not present

## 2019-04-14 DIAGNOSIS — E11311 Type 2 diabetes mellitus with unspecified diabetic retinopathy with macular edema: Secondary | ICD-10-CM | POA: Diagnosis not present

## 2019-04-14 DIAGNOSIS — H43813 Vitreous degeneration, bilateral: Secondary | ICD-10-CM

## 2019-05-13 DIAGNOSIS — H43811 Vitreous degeneration, right eye: Secondary | ICD-10-CM | POA: Diagnosis not present

## 2019-05-13 DIAGNOSIS — H34811 Central retinal vein occlusion, right eye, with macular edema: Secondary | ICD-10-CM | POA: Diagnosis not present

## 2019-05-13 DIAGNOSIS — E119 Type 2 diabetes mellitus without complications: Secondary | ICD-10-CM | POA: Diagnosis not present

## 2019-05-21 DIAGNOSIS — H34811 Central retinal vein occlusion, right eye, with macular edema: Secondary | ICD-10-CM | POA: Diagnosis not present

## 2019-06-18 DIAGNOSIS — H34811 Central retinal vein occlusion, right eye, with macular edema: Secondary | ICD-10-CM | POA: Diagnosis not present

## 2019-07-09 ENCOUNTER — Encounter (INDEPENDENT_AMBULATORY_CARE_PROVIDER_SITE_OTHER): Payer: Medicare HMO | Admitting: Ophthalmology

## 2019-07-09 ENCOUNTER — Other Ambulatory Visit: Payer: Self-pay

## 2019-07-09 DIAGNOSIS — E11311 Type 2 diabetes mellitus with unspecified diabetic retinopathy with macular edema: Secondary | ICD-10-CM | POA: Diagnosis not present

## 2019-07-09 DIAGNOSIS — I10 Essential (primary) hypertension: Secondary | ICD-10-CM

## 2019-07-09 DIAGNOSIS — E113392 Type 2 diabetes mellitus with moderate nonproliferative diabetic retinopathy without macular edema, left eye: Secondary | ICD-10-CM | POA: Diagnosis not present

## 2019-07-09 DIAGNOSIS — H35033 Hypertensive retinopathy, bilateral: Secondary | ICD-10-CM | POA: Diagnosis not present

## 2019-07-09 DIAGNOSIS — E113311 Type 2 diabetes mellitus with moderate nonproliferative diabetic retinopathy with macular edema, right eye: Secondary | ICD-10-CM

## 2019-07-09 DIAGNOSIS — H43813 Vitreous degeneration, bilateral: Secondary | ICD-10-CM | POA: Diagnosis not present

## 2019-08-06 ENCOUNTER — Encounter (INDEPENDENT_AMBULATORY_CARE_PROVIDER_SITE_OTHER): Payer: Medicare HMO | Admitting: Ophthalmology

## 2019-08-06 ENCOUNTER — Other Ambulatory Visit: Payer: Self-pay

## 2019-08-06 DIAGNOSIS — E113292 Type 2 diabetes mellitus with mild nonproliferative diabetic retinopathy without macular edema, left eye: Secondary | ICD-10-CM | POA: Diagnosis not present

## 2019-08-06 DIAGNOSIS — I10 Essential (primary) hypertension: Secondary | ICD-10-CM

## 2019-08-06 DIAGNOSIS — H43813 Vitreous degeneration, bilateral: Secondary | ICD-10-CM | POA: Diagnosis not present

## 2019-08-06 DIAGNOSIS — E11311 Type 2 diabetes mellitus with unspecified diabetic retinopathy with macular edema: Secondary | ICD-10-CM

## 2019-08-06 DIAGNOSIS — H35033 Hypertensive retinopathy, bilateral: Secondary | ICD-10-CM

## 2019-08-06 DIAGNOSIS — E113311 Type 2 diabetes mellitus with moderate nonproliferative diabetic retinopathy with macular edema, right eye: Secondary | ICD-10-CM | POA: Diagnosis not present

## 2019-08-18 DIAGNOSIS — E11319 Type 2 diabetes mellitus with unspecified diabetic retinopathy without macular edema: Secondary | ICD-10-CM | POA: Diagnosis not present

## 2019-08-18 DIAGNOSIS — Z299 Encounter for prophylactic measures, unspecified: Secondary | ICD-10-CM | POA: Diagnosis not present

## 2019-08-18 DIAGNOSIS — I10 Essential (primary) hypertension: Secondary | ICD-10-CM | POA: Diagnosis not present

## 2019-08-18 DIAGNOSIS — E1165 Type 2 diabetes mellitus with hyperglycemia: Secondary | ICD-10-CM | POA: Diagnosis not present

## 2019-08-18 DIAGNOSIS — Z6829 Body mass index (BMI) 29.0-29.9, adult: Secondary | ICD-10-CM | POA: Diagnosis not present

## 2019-08-18 DIAGNOSIS — Z23 Encounter for immunization: Secondary | ICD-10-CM | POA: Diagnosis not present

## 2019-09-08 ENCOUNTER — Encounter (INDEPENDENT_AMBULATORY_CARE_PROVIDER_SITE_OTHER): Payer: Medicare HMO | Admitting: Ophthalmology

## 2019-10-09 ENCOUNTER — Other Ambulatory Visit: Payer: Self-pay

## 2019-10-09 ENCOUNTER — Encounter (INDEPENDENT_AMBULATORY_CARE_PROVIDER_SITE_OTHER): Payer: Medicare HMO | Admitting: Ophthalmology

## 2019-10-09 DIAGNOSIS — H35033 Hypertensive retinopathy, bilateral: Secondary | ICD-10-CM | POA: Diagnosis not present

## 2019-10-09 DIAGNOSIS — I10 Essential (primary) hypertension: Secondary | ICD-10-CM

## 2019-10-09 DIAGNOSIS — H43813 Vitreous degeneration, bilateral: Secondary | ICD-10-CM | POA: Diagnosis not present

## 2019-10-09 DIAGNOSIS — E11311 Type 2 diabetes mellitus with unspecified diabetic retinopathy with macular edema: Secondary | ICD-10-CM

## 2019-10-09 DIAGNOSIS — E113311 Type 2 diabetes mellitus with moderate nonproliferative diabetic retinopathy with macular edema, right eye: Secondary | ICD-10-CM

## 2019-10-09 DIAGNOSIS — E113292 Type 2 diabetes mellitus with mild nonproliferative diabetic retinopathy without macular edema, left eye: Secondary | ICD-10-CM | POA: Diagnosis not present

## 2019-10-14 DIAGNOSIS — E1165 Type 2 diabetes mellitus with hyperglycemia: Secondary | ICD-10-CM | POA: Diagnosis not present

## 2019-10-14 DIAGNOSIS — R350 Frequency of micturition: Secondary | ICD-10-CM | POA: Diagnosis not present

## 2019-10-14 DIAGNOSIS — I10 Essential (primary) hypertension: Secondary | ICD-10-CM | POA: Diagnosis not present

## 2019-10-14 DIAGNOSIS — Z299 Encounter for prophylactic measures, unspecified: Secondary | ICD-10-CM | POA: Diagnosis not present

## 2019-10-14 DIAGNOSIS — N390 Urinary tract infection, site not specified: Secondary | ICD-10-CM | POA: Diagnosis not present

## 2019-10-14 DIAGNOSIS — K219 Gastro-esophageal reflux disease without esophagitis: Secondary | ICD-10-CM | POA: Diagnosis not present

## 2019-10-14 DIAGNOSIS — Z6824 Body mass index (BMI) 24.0-24.9, adult: Secondary | ICD-10-CM | POA: Diagnosis not present

## 2019-10-21 DIAGNOSIS — R5383 Other fatigue: Secondary | ICD-10-CM | POA: Diagnosis not present

## 2019-10-21 DIAGNOSIS — Z1339 Encounter for screening examination for other mental health and behavioral disorders: Secondary | ICD-10-CM | POA: Diagnosis not present

## 2019-10-21 DIAGNOSIS — Z6826 Body mass index (BMI) 26.0-26.9, adult: Secondary | ICD-10-CM | POA: Diagnosis not present

## 2019-10-21 DIAGNOSIS — E559 Vitamin D deficiency, unspecified: Secondary | ICD-10-CM | POA: Diagnosis not present

## 2019-10-21 DIAGNOSIS — E1165 Type 2 diabetes mellitus with hyperglycemia: Secondary | ICD-10-CM | POA: Diagnosis not present

## 2019-10-21 DIAGNOSIS — Z1211 Encounter for screening for malignant neoplasm of colon: Secondary | ICD-10-CM | POA: Diagnosis not present

## 2019-10-21 DIAGNOSIS — Z1331 Encounter for screening for depression: Secondary | ICD-10-CM | POA: Diagnosis not present

## 2019-10-21 DIAGNOSIS — E11319 Type 2 diabetes mellitus with unspecified diabetic retinopathy without macular edema: Secondary | ICD-10-CM | POA: Diagnosis not present

## 2019-10-21 DIAGNOSIS — Z7189 Other specified counseling: Secondary | ICD-10-CM | POA: Diagnosis not present

## 2019-10-21 DIAGNOSIS — Z79899 Other long term (current) drug therapy: Secondary | ICD-10-CM | POA: Diagnosis not present

## 2019-10-21 DIAGNOSIS — Z0000 Encounter for general adult medical examination without abnormal findings: Secondary | ICD-10-CM | POA: Diagnosis not present

## 2019-10-21 DIAGNOSIS — Z299 Encounter for prophylactic measures, unspecified: Secondary | ICD-10-CM | POA: Diagnosis not present

## 2019-10-21 DIAGNOSIS — E7800 Pure hypercholesterolemia, unspecified: Secondary | ICD-10-CM | POA: Diagnosis not present

## 2019-10-21 DIAGNOSIS — E039 Hypothyroidism, unspecified: Secondary | ICD-10-CM | POA: Diagnosis not present

## 2019-11-06 ENCOUNTER — Encounter (INDEPENDENT_AMBULATORY_CARE_PROVIDER_SITE_OTHER): Payer: Medicare HMO | Admitting: Ophthalmology

## 2019-11-06 DIAGNOSIS — E113311 Type 2 diabetes mellitus with moderate nonproliferative diabetic retinopathy with macular edema, right eye: Secondary | ICD-10-CM | POA: Diagnosis not present

## 2019-11-06 DIAGNOSIS — E11311 Type 2 diabetes mellitus with unspecified diabetic retinopathy with macular edema: Secondary | ICD-10-CM

## 2019-11-06 DIAGNOSIS — I10 Essential (primary) hypertension: Secondary | ICD-10-CM

## 2019-11-06 DIAGNOSIS — E113292 Type 2 diabetes mellitus with mild nonproliferative diabetic retinopathy without macular edema, left eye: Secondary | ICD-10-CM | POA: Diagnosis not present

## 2019-11-06 DIAGNOSIS — H35033 Hypertensive retinopathy, bilateral: Secondary | ICD-10-CM

## 2019-11-06 DIAGNOSIS — H43813 Vitreous degeneration, bilateral: Secondary | ICD-10-CM | POA: Diagnosis not present

## 2019-11-14 DIAGNOSIS — R32 Unspecified urinary incontinence: Secondary | ICD-10-CM | POA: Diagnosis not present

## 2019-11-14 DIAGNOSIS — Z01419 Encounter for gynecological examination (general) (routine) without abnormal findings: Secondary | ICD-10-CM | POA: Diagnosis not present

## 2019-11-14 DIAGNOSIS — Z1231 Encounter for screening mammogram for malignant neoplasm of breast: Secondary | ICD-10-CM | POA: Diagnosis not present

## 2019-11-14 DIAGNOSIS — N393 Stress incontinence (female) (male): Secondary | ICD-10-CM | POA: Diagnosis not present

## 2019-11-18 ENCOUNTER — Other Ambulatory Visit: Payer: Self-pay | Admitting: Obstetrics and Gynecology

## 2019-11-18 DIAGNOSIS — R928 Other abnormal and inconclusive findings on diagnostic imaging of breast: Secondary | ICD-10-CM

## 2019-11-24 DIAGNOSIS — Z789 Other specified health status: Secondary | ICD-10-CM | POA: Diagnosis not present

## 2019-11-24 DIAGNOSIS — E1142 Type 2 diabetes mellitus with diabetic polyneuropathy: Secondary | ICD-10-CM | POA: Diagnosis not present

## 2019-11-24 DIAGNOSIS — R350 Frequency of micturition: Secondary | ICD-10-CM | POA: Diagnosis not present

## 2019-11-24 DIAGNOSIS — E11319 Type 2 diabetes mellitus with unspecified diabetic retinopathy without macular edema: Secondary | ICD-10-CM | POA: Diagnosis not present

## 2019-11-24 DIAGNOSIS — Z299 Encounter for prophylactic measures, unspecified: Secondary | ICD-10-CM | POA: Diagnosis not present

## 2019-11-24 DIAGNOSIS — Z6827 Body mass index (BMI) 27.0-27.9, adult: Secondary | ICD-10-CM | POA: Diagnosis not present

## 2019-11-24 DIAGNOSIS — E1165 Type 2 diabetes mellitus with hyperglycemia: Secondary | ICD-10-CM | POA: Diagnosis not present

## 2019-12-04 ENCOUNTER — Other Ambulatory Visit: Payer: Self-pay

## 2019-12-04 ENCOUNTER — Encounter (INDEPENDENT_AMBULATORY_CARE_PROVIDER_SITE_OTHER): Payer: Medicare HMO | Admitting: Ophthalmology

## 2019-12-04 DIAGNOSIS — H43813 Vitreous degeneration, bilateral: Secondary | ICD-10-CM

## 2019-12-04 DIAGNOSIS — E113292 Type 2 diabetes mellitus with mild nonproliferative diabetic retinopathy without macular edema, left eye: Secondary | ICD-10-CM | POA: Diagnosis not present

## 2019-12-04 DIAGNOSIS — E113311 Type 2 diabetes mellitus with moderate nonproliferative diabetic retinopathy with macular edema, right eye: Secondary | ICD-10-CM

## 2019-12-04 DIAGNOSIS — H34811 Central retinal vein occlusion, right eye, with macular edema: Secondary | ICD-10-CM | POA: Diagnosis not present

## 2019-12-04 DIAGNOSIS — H35033 Hypertensive retinopathy, bilateral: Secondary | ICD-10-CM

## 2019-12-04 DIAGNOSIS — I10 Essential (primary) hypertension: Secondary | ICD-10-CM

## 2019-12-04 DIAGNOSIS — E11311 Type 2 diabetes mellitus with unspecified diabetic retinopathy with macular edema: Secondary | ICD-10-CM | POA: Diagnosis not present

## 2019-12-05 ENCOUNTER — Other Ambulatory Visit: Payer: Self-pay | Admitting: Obstetrics and Gynecology

## 2019-12-05 ENCOUNTER — Ambulatory Visit
Admission: RE | Admit: 2019-12-05 | Discharge: 2019-12-05 | Disposition: A | Discharge status: Home / Self Care | Payer: Medicare HMO | Source: Ambulatory Visit | Attending: Obstetrics and Gynecology | Admitting: Obstetrics and Gynecology

## 2019-12-05 DIAGNOSIS — R928 Other abnormal and inconclusive findings on diagnostic imaging of breast: Secondary | ICD-10-CM | POA: Diagnosis not present

## 2019-12-16 ENCOUNTER — Other Ambulatory Visit: Payer: Self-pay

## 2019-12-16 ENCOUNTER — Ambulatory Visit
Admission: RE | Admit: 2019-12-16 | Discharge: 2019-12-16 | Disposition: A | Discharge status: Home / Self Care | Payer: Medicare HMO | Source: Ambulatory Visit | Attending: Obstetrics and Gynecology | Admitting: Obstetrics and Gynecology

## 2019-12-16 DIAGNOSIS — R928 Other abnormal and inconclusive findings on diagnostic imaging of breast: Secondary | ICD-10-CM

## 2019-12-16 DIAGNOSIS — R921 Mammographic calcification found on diagnostic imaging of breast: Secondary | ICD-10-CM | POA: Diagnosis not present

## 2019-12-16 DIAGNOSIS — N6011 Diffuse cystic mastopathy of right breast: Secondary | ICD-10-CM | POA: Diagnosis not present

## 2019-12-20 ENCOUNTER — Ambulatory Visit: Payer: Medicare HMO | Attending: Internal Medicine

## 2019-12-20 DIAGNOSIS — Z23 Encounter for immunization: Secondary | ICD-10-CM | POA: Insufficient documentation

## 2019-12-20 NOTE — Progress Notes (Signed)
   Covid-19 Vaccination Clinic  Name:  Carla Nguyen    MRN: [id number] DOB: 01-Dec-1945  12/20/2019  Carla Nguyen was observed post Covid-19 immunization for 15 minutes without incidence. She was provided with Vaccine Information Sheet and instruction to access the V-Safe system.   Carla Nguyen was instructed to call 911 with any severe reactions post vaccine: Marland Kitchen Difficulty breathing  . Swelling of your face and throat  . A fast heartbeat  . A bad rash all over your body  . Dizziness and weakness    Immunizations Administered    Name Date Dose VIS Date Route   Pfizer COVID-19 Vaccine 12/20/2019  2:55 PM 0.3 mL 10/10/2019 Intramuscular   Manufacturer: [employer], [employer]   Lot: [id number]   NDC: [address]-9

## 2019-12-23 DIAGNOSIS — M79672 Pain in left foot: Secondary | ICD-10-CM | POA: Diagnosis not present

## 2019-12-23 DIAGNOSIS — E1140 Type 2 diabetes mellitus with diabetic neuropathy, unspecified: Secondary | ICD-10-CM | POA: Diagnosis not present

## 2019-12-23 DIAGNOSIS — E1159 Type 2 diabetes mellitus with other circulatory complications: Secondary | ICD-10-CM | POA: Diagnosis not present

## 2020-01-01 ENCOUNTER — Encounter (INDEPENDENT_AMBULATORY_CARE_PROVIDER_SITE_OTHER): Payer: Medicare HMO | Admitting: Ophthalmology

## 2020-01-01 DIAGNOSIS — E113311 Type 2 diabetes mellitus with moderate nonproliferative diabetic retinopathy with macular edema, right eye: Secondary | ICD-10-CM

## 2020-01-01 DIAGNOSIS — H43813 Vitreous degeneration, bilateral: Secondary | ICD-10-CM | POA: Diagnosis not present

## 2020-01-01 DIAGNOSIS — E11311 Type 2 diabetes mellitus with unspecified diabetic retinopathy with macular edema: Secondary | ICD-10-CM | POA: Diagnosis not present

## 2020-01-01 DIAGNOSIS — H34811 Central retinal vein occlusion, right eye, with macular edema: Secondary | ICD-10-CM

## 2020-01-01 DIAGNOSIS — H35033 Hypertensive retinopathy, bilateral: Secondary | ICD-10-CM

## 2020-01-01 DIAGNOSIS — I10 Essential (primary) hypertension: Secondary | ICD-10-CM

## 2020-01-01 DIAGNOSIS — E113292 Type 2 diabetes mellitus with mild nonproliferative diabetic retinopathy without macular edema, left eye: Secondary | ICD-10-CM

## 2020-01-13 ENCOUNTER — Ambulatory Visit: Payer: Medicare HMO | Attending: Internal Medicine

## 2020-01-13 DIAGNOSIS — Z23 Encounter for immunization: Secondary | ICD-10-CM

## 2020-01-13 NOTE — Progress Notes (Signed)
   Covid-19 Vaccination Clinic  Name:  Carla Nguyen    MRN: [id number] DOB: 1945/12/05  01/13/2020  [name] was observed post Covid-19 immunization for 15 minutes without incident. She was provided with Vaccine Information Sheet and instruction to access the V-Safe system.   [name] was instructed to call 911 with any severe reactions post vaccine: Marland Kitchen Difficulty breathing  . Swelling of face and throat  . A fast heartbeat  . A bad rash all over body  . Dizziness and weakness   Immunizations Administered    Name Date Dose VIS Date Route   Pfizer COVID-19 Vaccine 01/13/2020 10:06 AM 0.3 mL 10/10/2019 Intramuscular   Manufacturer: [employer], [employer]   Lot: [id number]   NDC: [address]-0

## 2020-01-16 DIAGNOSIS — Z299 Encounter for prophylactic measures, unspecified: Secondary | ICD-10-CM | POA: Diagnosis not present

## 2020-01-16 DIAGNOSIS — I10 Essential (primary) hypertension: Secondary | ICD-10-CM | POA: Diagnosis not present

## 2020-01-16 DIAGNOSIS — E1165 Type 2 diabetes mellitus with hyperglycemia: Secondary | ICD-10-CM | POA: Diagnosis not present

## 2020-01-16 DIAGNOSIS — E11319 Type 2 diabetes mellitus with unspecified diabetic retinopathy without macular edema: Secondary | ICD-10-CM | POA: Diagnosis not present

## 2020-01-16 DIAGNOSIS — E039 Hypothyroidism, unspecified: Secondary | ICD-10-CM | POA: Diagnosis not present

## 2020-01-16 DIAGNOSIS — E1142 Type 2 diabetes mellitus with diabetic polyneuropathy: Secondary | ICD-10-CM | POA: Diagnosis not present

## 2020-02-02 ENCOUNTER — Encounter (INDEPENDENT_AMBULATORY_CARE_PROVIDER_SITE_OTHER): Payer: Medicare HMO | Admitting: Ophthalmology

## 2020-02-02 DIAGNOSIS — I10 Essential (primary) hypertension: Secondary | ICD-10-CM

## 2020-02-02 DIAGNOSIS — H35033 Hypertensive retinopathy, bilateral: Secondary | ICD-10-CM | POA: Diagnosis not present

## 2020-02-02 DIAGNOSIS — E113292 Type 2 diabetes mellitus with mild nonproliferative diabetic retinopathy without macular edema, left eye: Secondary | ICD-10-CM

## 2020-02-02 DIAGNOSIS — H34811 Central retinal vein occlusion, right eye, with macular edema: Secondary | ICD-10-CM | POA: Diagnosis not present

## 2020-02-02 DIAGNOSIS — H43813 Vitreous degeneration, bilateral: Secondary | ICD-10-CM | POA: Diagnosis not present

## 2020-02-02 DIAGNOSIS — E113311 Type 2 diabetes mellitus with moderate nonproliferative diabetic retinopathy with macular edema, right eye: Secondary | ICD-10-CM

## 2020-02-02 DIAGNOSIS — E11311 Type 2 diabetes mellitus with unspecified diabetic retinopathy with macular edema: Secondary | ICD-10-CM

## 2020-02-05 ENCOUNTER — Encounter (INDEPENDENT_AMBULATORY_CARE_PROVIDER_SITE_OTHER): Payer: Medicare HMO | Admitting: Ophthalmology

## 2020-02-18 IMAGING — MG MM BREAST LOCALIZATION CLIP
4 series · 4 of 12 positions shown · non-contrast
Comparison: Previous exam(s).

CLINICAL DATA: Confirmation clip placement after stereotactic
tomosynthesis core needle biopsy of an indeterminate 5 mm group of
calcifications involving the LOWER RIGHT breast at ANTERIOR depth,
slight LOWER INNER QUADRANT.

EXAM:
2D AND TOMOSYNTHESIS DIAGNOSTIC RIGHT MAMMOGRAM POST ULTRASOUND
BIOPSY

[R LM synth-2D]
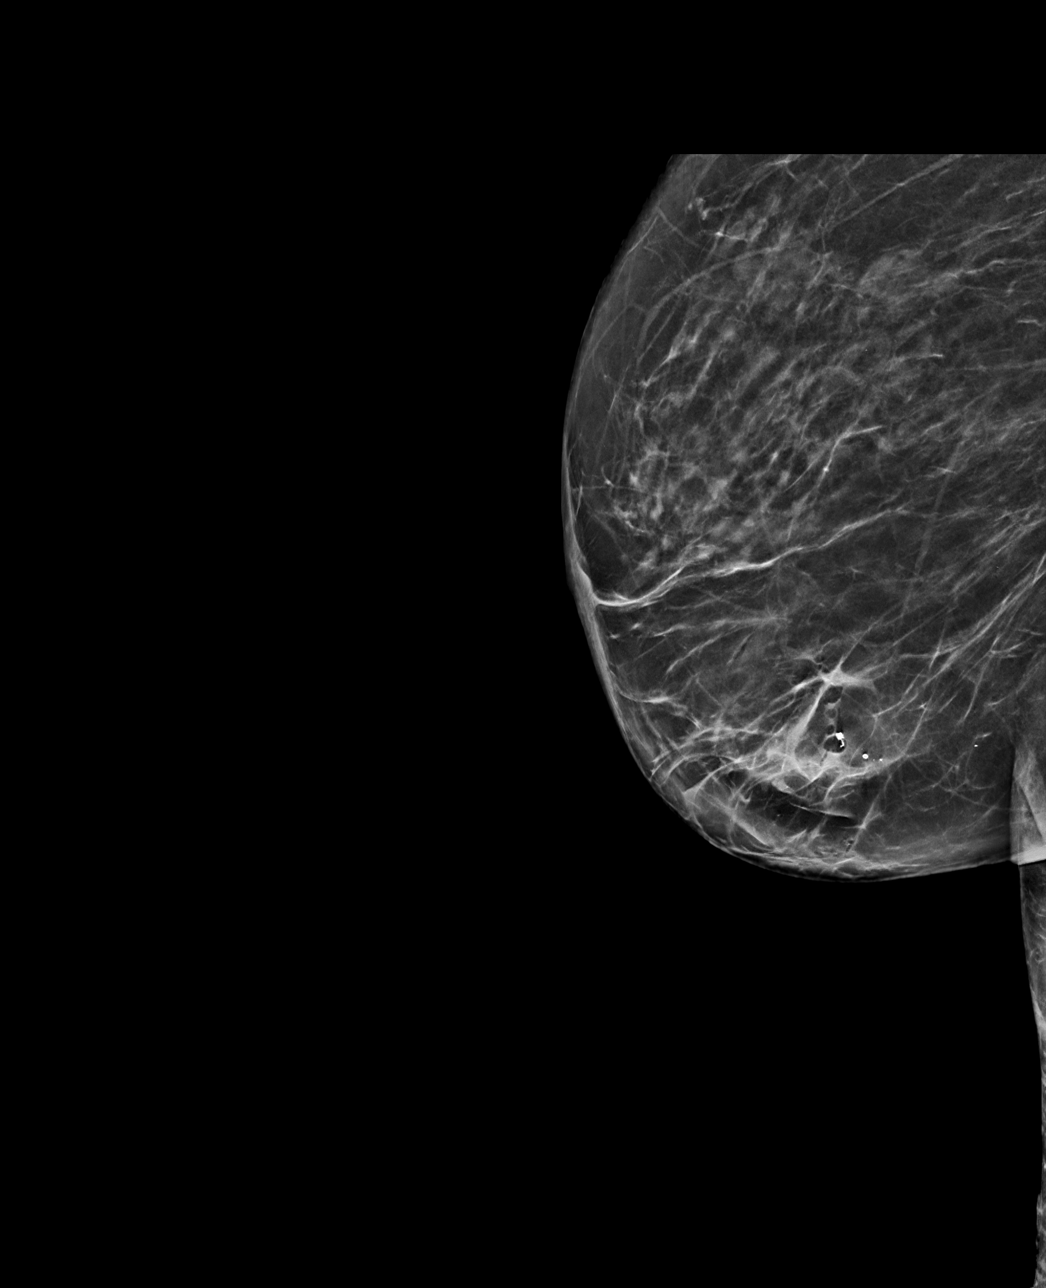

[R CC synth-2D]
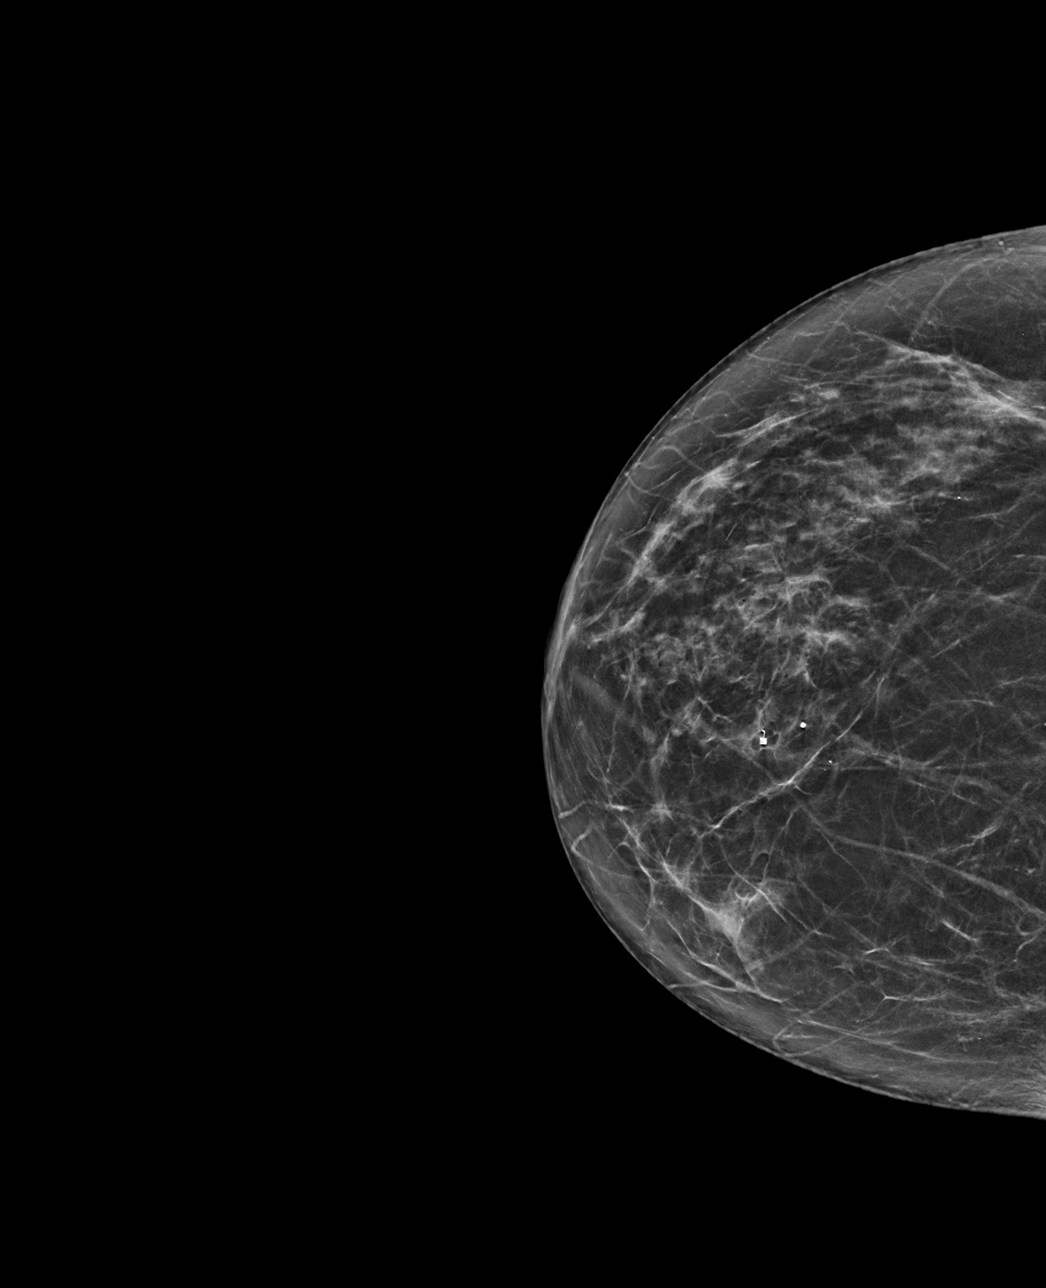

[R LM tomo · tomo slice 35/69.0]
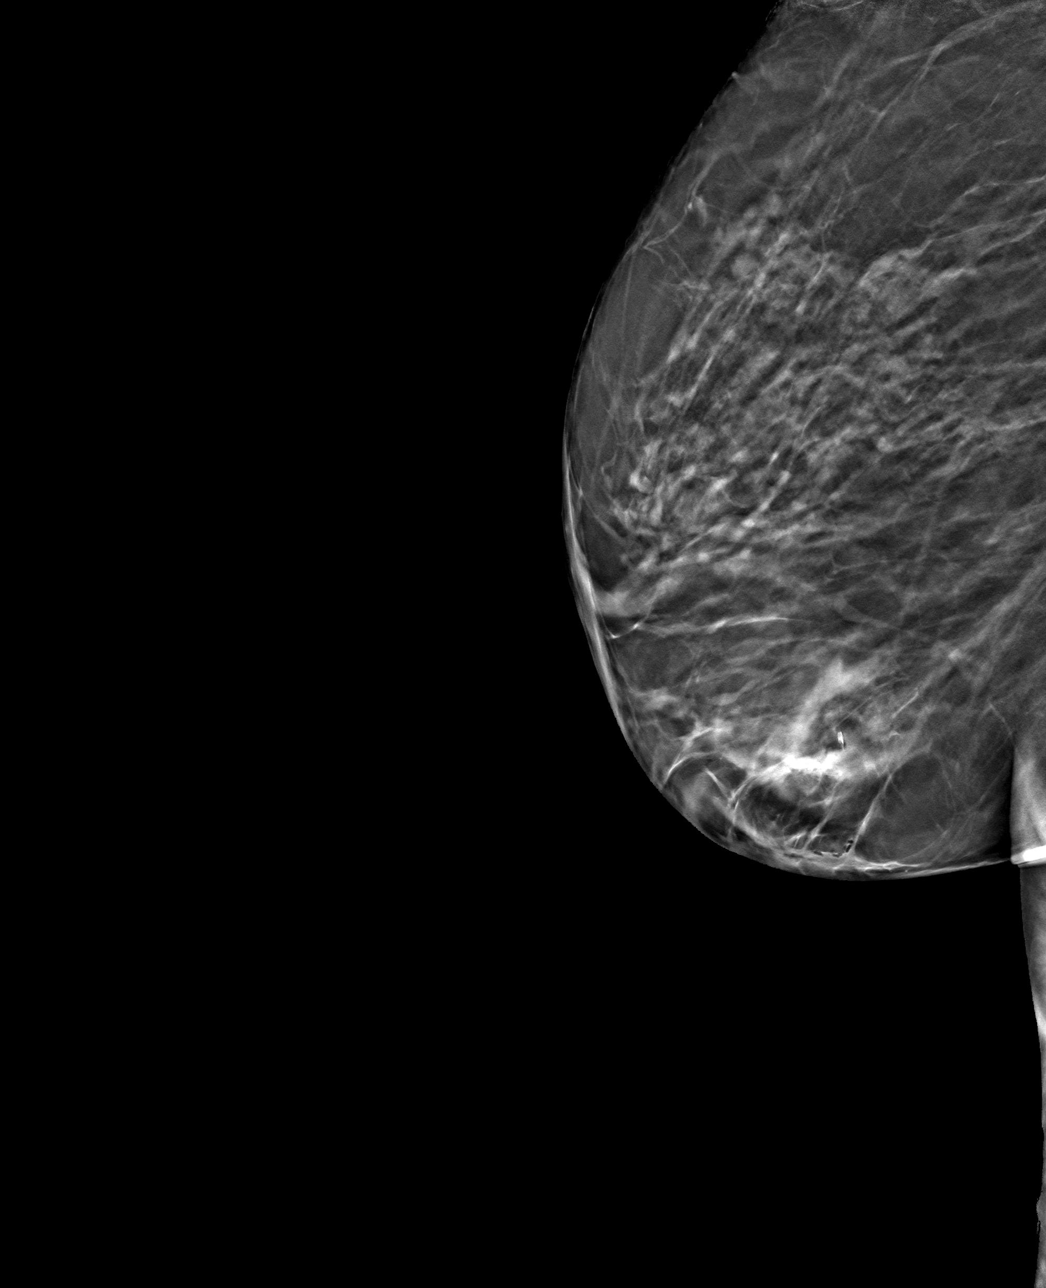

[R CC tomo · tomo slice 35/68.0]
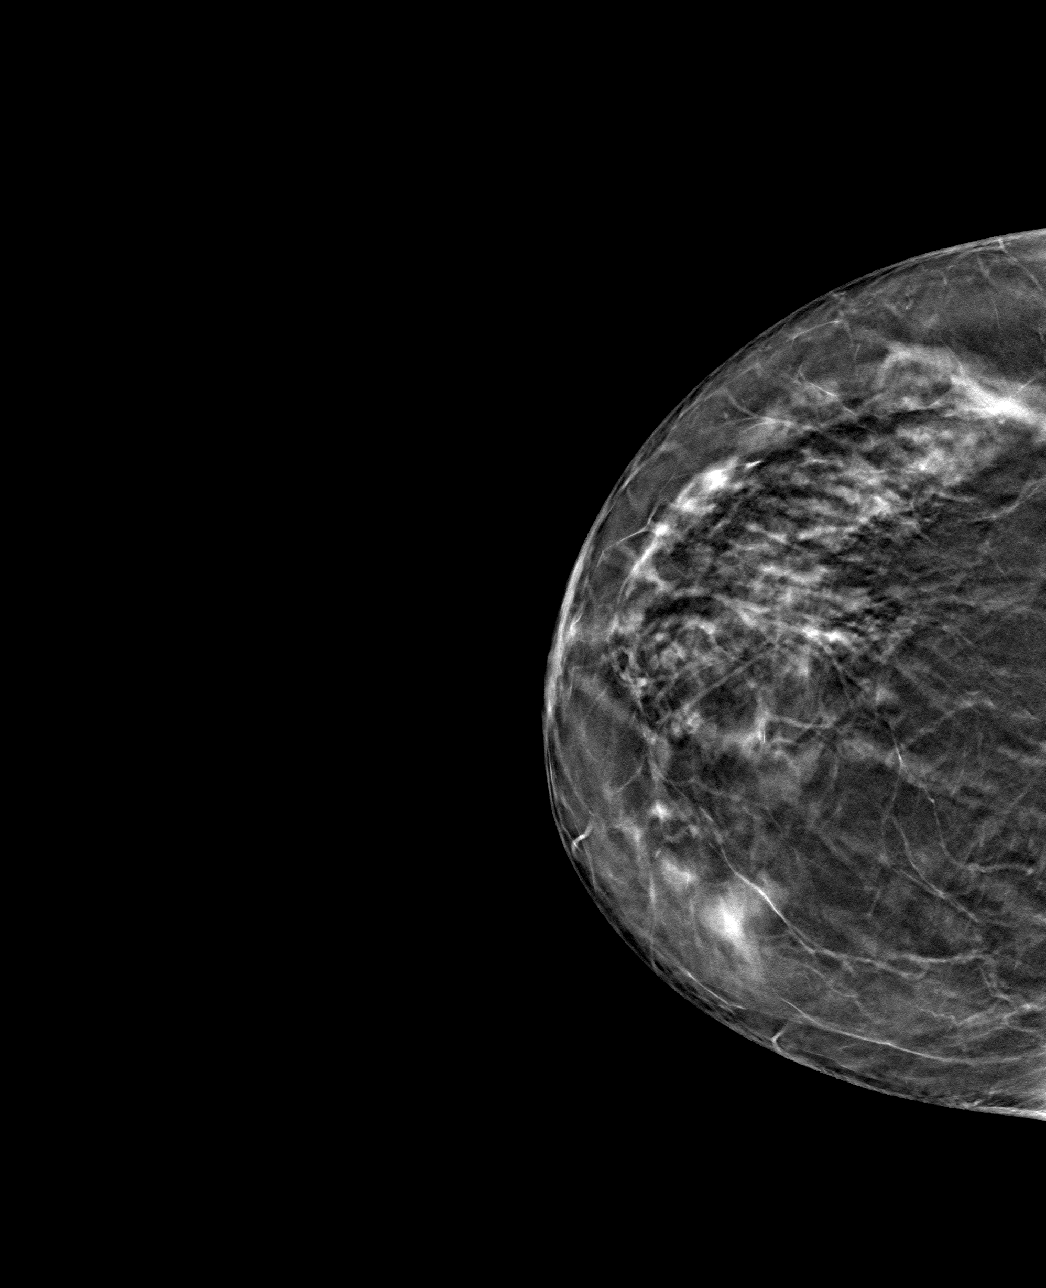

[4 of 12 positions shown; findings below may reference images not displayed]

FINDINGS: Tomosynthesis and synthesized full field CC and mediolateral images
were obtained following stereotactic tomosynthesis guided biopsy of
calcifications involving the LOWER RIGHT breast at ANTERIOR depth,
slight LOWER INNER QUADRANT. The coil shaped tissue marking clip is
appropriately position at the site of the biopsied calcifications.
All of the calcifications were removed with the biopsy.

Expected post biopsy changes are present without evidence of
hematoma.
IMPRESSION: 1. Appropriate positioning of the coil shaped tissue marker clip at
the site of the biopsied calcifications in the LOWER RIGHT breast at
ANTERIOR depth, slight LOWER INNER QUADRANT.
2. All of the calcifications were removed with the biopsy.

Final Assessment: Post Procedure Mammograms for Marker Placement

## 2020-02-18 IMAGING — MG MM BREAST BX W/ LOC DEV 1ST LESION IMAGE BX SPEC STEREO GUIDE*R*
8 of 9 series · 8 of 17 positions shown · non-contrast
Comparison: Previous exams.
COMPARISON: Previous exams.

Addendum:
CLINICAL DATA: 73-year-old with a screening detected indeterminate
5 mm group of calcifications involving the LOWER RIGHT breast at
ANTERIOR depth, slight LOWER INNER QUADRANT.

EXAM:
RIGHT BREAST STEREOTACTIC CORE NEEDLE BIOPSY

[R (1 of 4)]
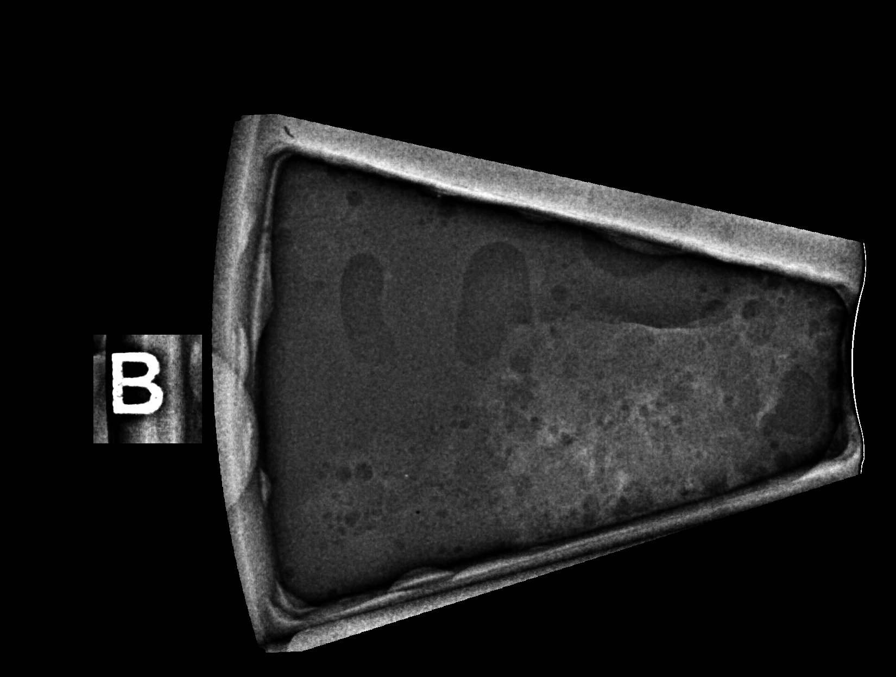

[R (2 of 4)]
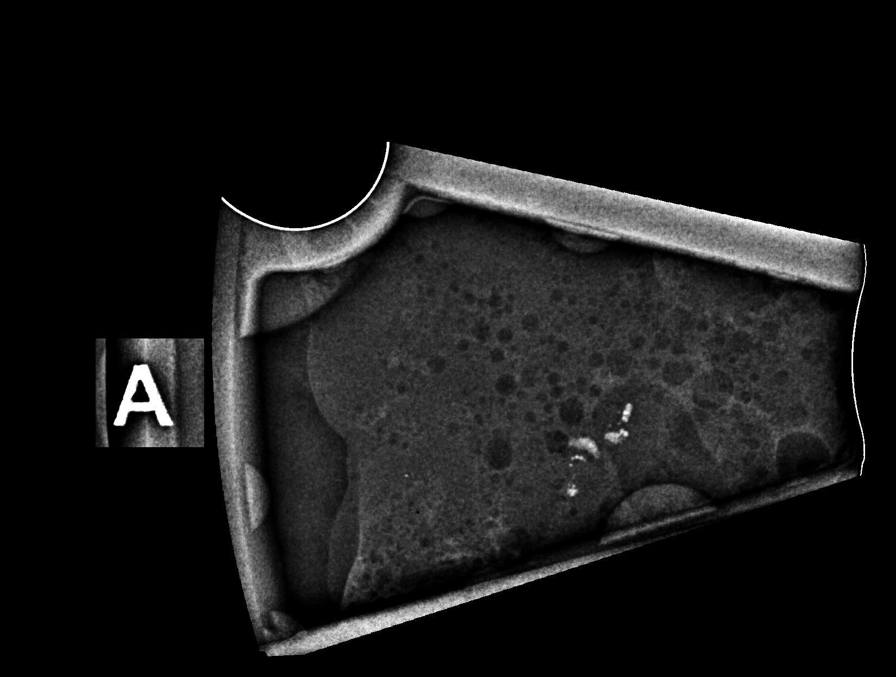

[R (3 of 4)]
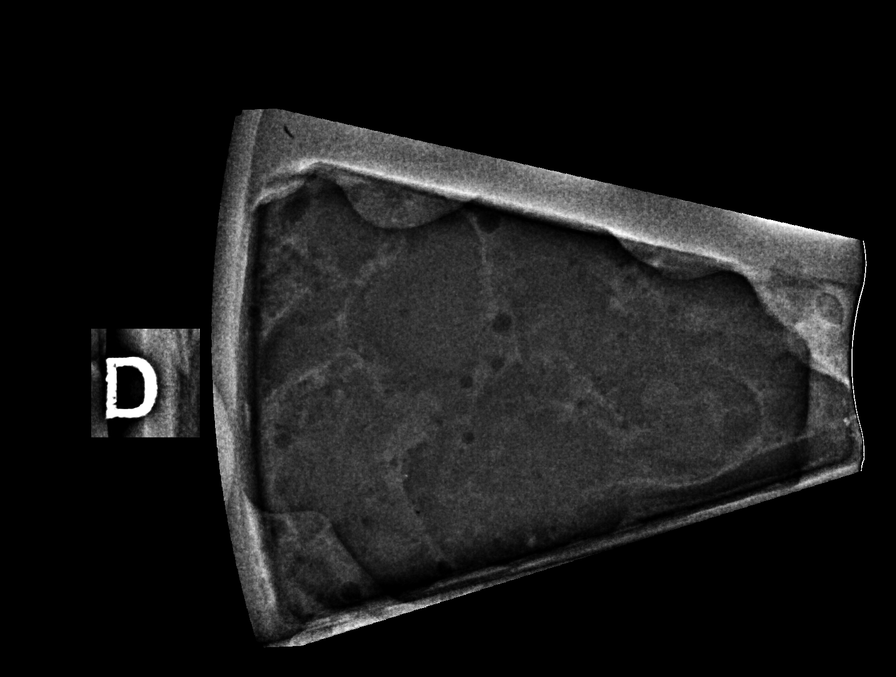

[R (4 of 4)]
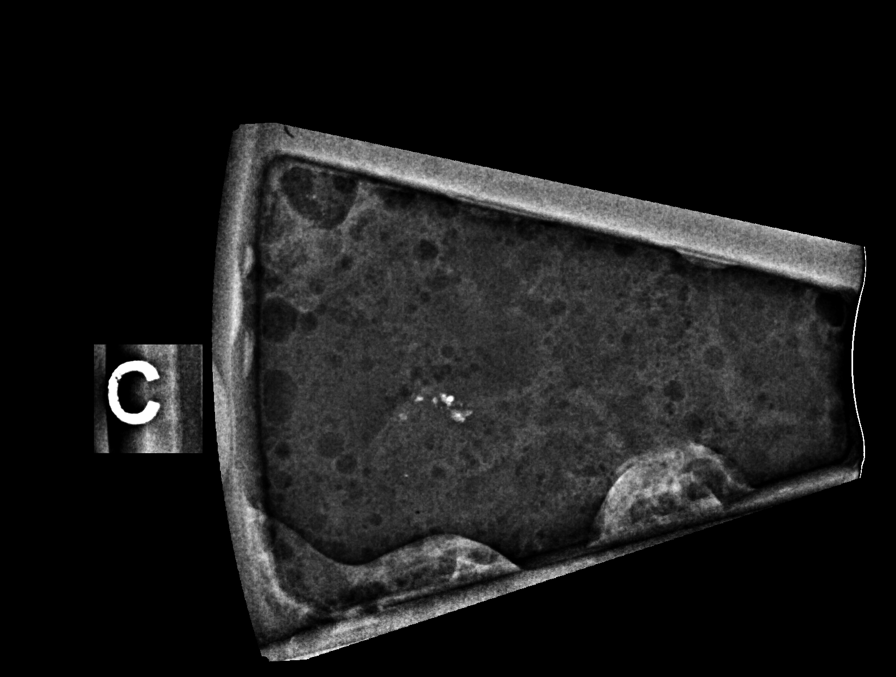

[R LM (1 of 3)]
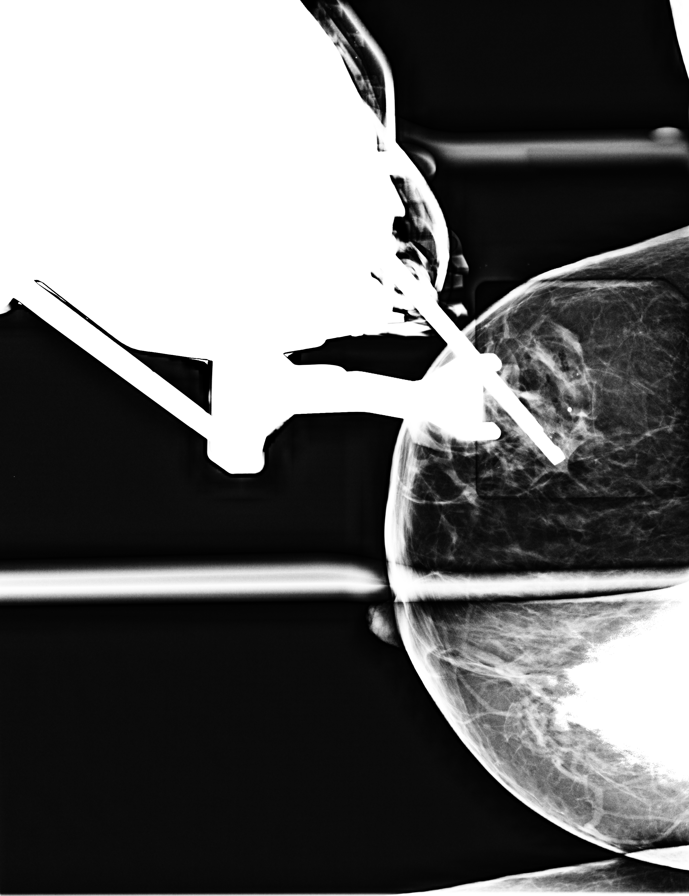

[R LM (2 of 3)]
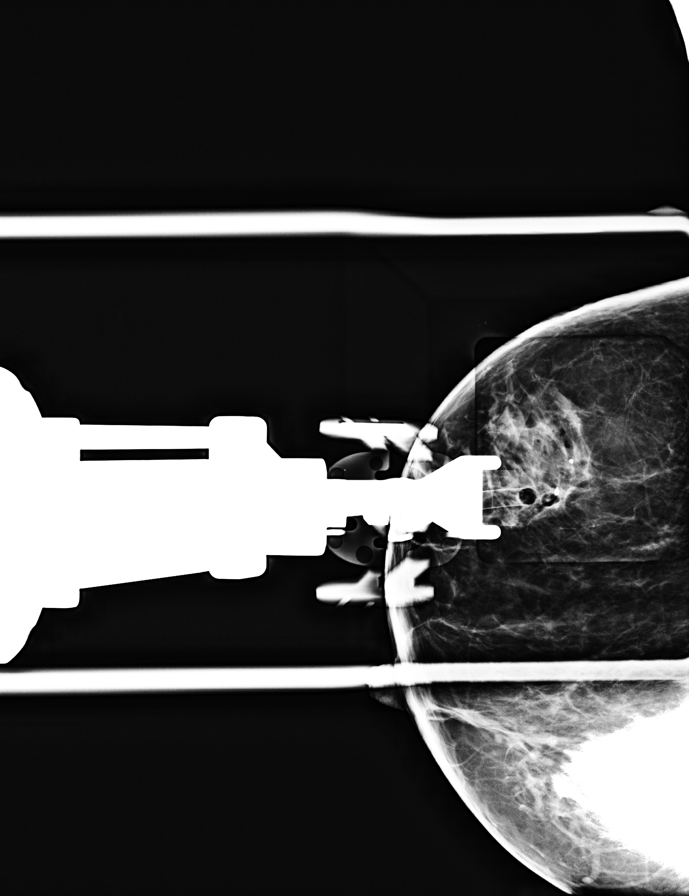

[R LM (3 of 3)]
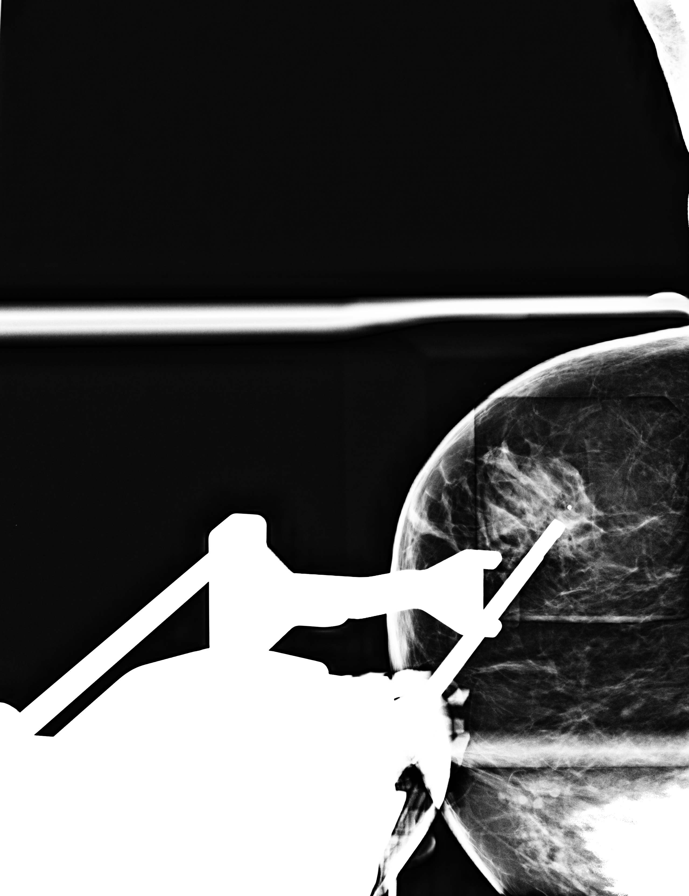

[R LM tomo · tomo slice 15/30.0]
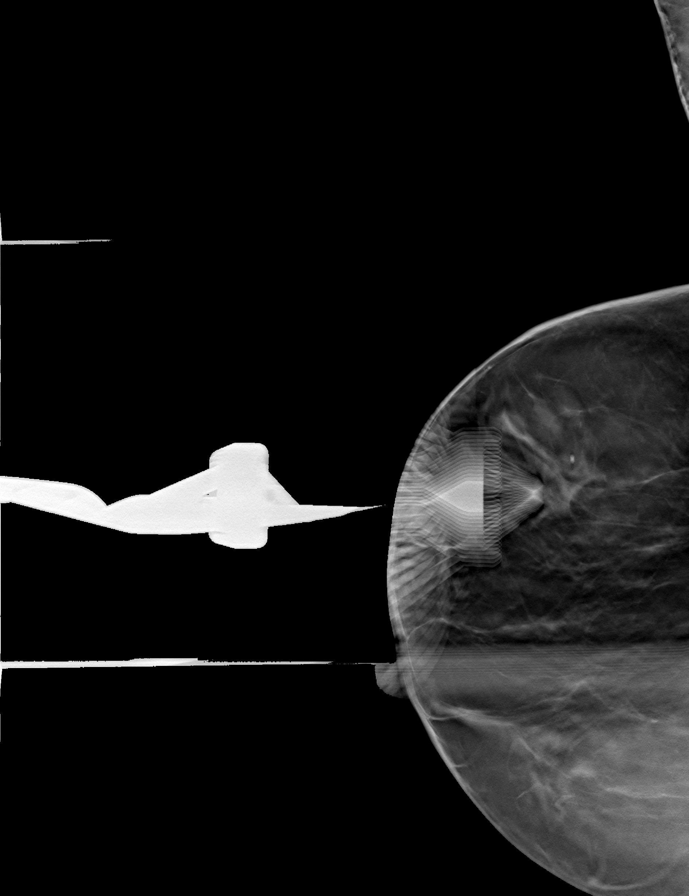

[8 of 17 positions shown; findings below may reference images not displayed]



Lesion quadrant: Slight LOWER INNER QUADRANT.

Using sterile technique with chlorhexidine as skin antisepsis, 1%
lidocaine and 1% lidocaine with epinephrine as local anesthetic,
under stereotactic tomosynthesis guidance, a 9 gauge Brevera vacuum
assisted device was used to perform core needle biopsy of
calcifications involving the lower RIGHT breast, slight LOWER INNER
QUADRANT, using a lateral approach. Specimen radiograph was
performed showing calcifications in at least 2 of the 4 core
samples. Specimens with calcifications are identified for pathology.

At the conclusion of the procedure, a coil shaped tissue marker clip
was deployed into the biopsy cavity. Follow-up 2-view mammogram was
performed and dictated separately.
IMPRESSION: Stereotactic-guided biopsy of an indeterminate 5 mm group of
calcifications involving the LOWER RIGHT breast at ANTERIOR depth,
slight LOWER INNER QUADRANT. No apparent complications.

ADDENDUM:
Pathology revealed FIBROCYSTIC CHANGES WITH APOCRINE METAPLASIA,
CALCIFICATIONS of the Right breast, lower. This was found to be
concordant by Dr. Siverekli Palaz.

Pathology results were discussed with the patient by telephone. The
patient reported doing well after the biopsy with tenderness at the
site. Post biopsy instructions and care were reviewed and questions
were answered. The patient was encouraged to call The [REDACTED]

The patient was instructed to return for annual screening

Pathology results reported by Enyeribe Sho, RN on 12/18/2019.



Lesion quadrant: Slight LOWER INNER QUADRANT.

Using sterile technique with chlorhexidine as skin antisepsis, 1%
lidocaine and 1% lidocaine with epinephrine as local anesthetic,
under stereotactic tomosynthesis guidance, a 9 gauge Brevera vacuum
assisted device was used to perform core needle biopsy of
calcifications involving the lower RIGHT breast, slight LOWER INNER
QUADRANT, using a lateral approach. Specimen radiograph was
performed showing calcifications in at least 2 of the 4 core
samples. Specimens with calcifications are identified for pathology.

At the conclusion of the procedure, a coil shaped tissue marker clip
was deployed into the biopsy cavity. Follow-up 2-view mammogram was
performed and dictated separately.
IMPRESSION: Stereotactic-guided biopsy of an indeterminate 5 mm group of
calcifications involving the LOWER RIGHT breast at ANTERIOR depth,
slight LOWER INNER QUADRANT. No apparent complications.

## 2020-02-27 DIAGNOSIS — I10 Essential (primary) hypertension: Secondary | ICD-10-CM | POA: Diagnosis not present

## 2020-02-27 DIAGNOSIS — Z299 Encounter for prophylactic measures, unspecified: Secondary | ICD-10-CM | POA: Diagnosis not present

## 2020-02-27 DIAGNOSIS — E1142 Type 2 diabetes mellitus with diabetic polyneuropathy: Secondary | ICD-10-CM | POA: Diagnosis not present

## 2020-02-27 DIAGNOSIS — E1165 Type 2 diabetes mellitus with hyperglycemia: Secondary | ICD-10-CM | POA: Diagnosis not present

## 2020-03-01 ENCOUNTER — Other Ambulatory Visit: Payer: Self-pay

## 2020-03-01 ENCOUNTER — Encounter (INDEPENDENT_AMBULATORY_CARE_PROVIDER_SITE_OTHER): Payer: Medicare HMO | Admitting: Ophthalmology

## 2020-03-01 DIAGNOSIS — E113311 Type 2 diabetes mellitus with moderate nonproliferative diabetic retinopathy with macular edema, right eye: Secondary | ICD-10-CM

## 2020-03-01 DIAGNOSIS — E113392 Type 2 diabetes mellitus with moderate nonproliferative diabetic retinopathy without macular edema, left eye: Secondary | ICD-10-CM | POA: Diagnosis not present

## 2020-03-01 DIAGNOSIS — H35033 Hypertensive retinopathy, bilateral: Secondary | ICD-10-CM

## 2020-03-01 DIAGNOSIS — H34811 Central retinal vein occlusion, right eye, with macular edema: Secondary | ICD-10-CM | POA: Diagnosis not present

## 2020-03-01 DIAGNOSIS — H43813 Vitreous degeneration, bilateral: Secondary | ICD-10-CM

## 2020-03-01 DIAGNOSIS — E11311 Type 2 diabetes mellitus with unspecified diabetic retinopathy with macular edema: Secondary | ICD-10-CM

## 2020-03-01 DIAGNOSIS — I10 Essential (primary) hypertension: Secondary | ICD-10-CM

## 2020-03-29 DIAGNOSIS — I10 Essential (primary) hypertension: Secondary | ICD-10-CM | POA: Diagnosis not present

## 2020-03-29 DIAGNOSIS — E7800 Pure hypercholesterolemia, unspecified: Secondary | ICD-10-CM | POA: Diagnosis not present

## 2020-03-30 ENCOUNTER — Other Ambulatory Visit: Payer: Self-pay

## 2020-03-30 ENCOUNTER — Encounter (INDEPENDENT_AMBULATORY_CARE_PROVIDER_SITE_OTHER): Payer: Medicare HMO | Admitting: Ophthalmology

## 2020-03-30 DIAGNOSIS — H34811 Central retinal vein occlusion, right eye, with macular edema: Secondary | ICD-10-CM

## 2020-03-30 DIAGNOSIS — I10 Essential (primary) hypertension: Secondary | ICD-10-CM

## 2020-03-30 DIAGNOSIS — E11311 Type 2 diabetes mellitus with unspecified diabetic retinopathy with macular edema: Secondary | ICD-10-CM | POA: Diagnosis not present

## 2020-03-30 DIAGNOSIS — E113292 Type 2 diabetes mellitus with mild nonproliferative diabetic retinopathy without macular edema, left eye: Secondary | ICD-10-CM | POA: Diagnosis not present

## 2020-03-30 DIAGNOSIS — H35033 Hypertensive retinopathy, bilateral: Secondary | ICD-10-CM | POA: Diagnosis not present

## 2020-03-30 DIAGNOSIS — H43813 Vitreous degeneration, bilateral: Secondary | ICD-10-CM | POA: Diagnosis not present

## 2020-03-30 DIAGNOSIS — E113311 Type 2 diabetes mellitus with moderate nonproliferative diabetic retinopathy with macular edema, right eye: Secondary | ICD-10-CM | POA: Diagnosis not present

## 2020-04-14 DIAGNOSIS — I10 Essential (primary) hypertension: Secondary | ICD-10-CM | POA: Diagnosis not present

## 2020-04-14 DIAGNOSIS — E1165 Type 2 diabetes mellitus with hyperglycemia: Secondary | ICD-10-CM | POA: Diagnosis not present

## 2020-04-14 DIAGNOSIS — Z299 Encounter for prophylactic measures, unspecified: Secondary | ICD-10-CM | POA: Diagnosis not present

## 2020-04-26 ENCOUNTER — Other Ambulatory Visit: Payer: Self-pay

## 2020-04-26 ENCOUNTER — Encounter (INDEPENDENT_AMBULATORY_CARE_PROVIDER_SITE_OTHER): Payer: Medicare HMO | Admitting: Ophthalmology

## 2020-04-26 DIAGNOSIS — H35033 Hypertensive retinopathy, bilateral: Secondary | ICD-10-CM

## 2020-04-26 DIAGNOSIS — H43813 Vitreous degeneration, bilateral: Secondary | ICD-10-CM

## 2020-04-26 DIAGNOSIS — E11311 Type 2 diabetes mellitus with unspecified diabetic retinopathy with macular edema: Secondary | ICD-10-CM | POA: Diagnosis not present

## 2020-04-26 DIAGNOSIS — I10 Essential (primary) hypertension: Secondary | ICD-10-CM

## 2020-04-26 DIAGNOSIS — E113292 Type 2 diabetes mellitus with mild nonproliferative diabetic retinopathy without macular edema, left eye: Secondary | ICD-10-CM

## 2020-04-26 DIAGNOSIS — H34811 Central retinal vein occlusion, right eye, with macular edema: Secondary | ICD-10-CM | POA: Diagnosis not present

## 2020-04-26 DIAGNOSIS — E113311 Type 2 diabetes mellitus with moderate nonproliferative diabetic retinopathy with macular edema, right eye: Secondary | ICD-10-CM | POA: Diagnosis not present

## 2020-05-24 ENCOUNTER — Other Ambulatory Visit: Payer: Self-pay

## 2020-05-24 ENCOUNTER — Encounter (INDEPENDENT_AMBULATORY_CARE_PROVIDER_SITE_OTHER): Payer: Medicare HMO | Admitting: Ophthalmology

## 2020-05-24 DIAGNOSIS — H35033 Hypertensive retinopathy, bilateral: Secondary | ICD-10-CM

## 2020-05-24 DIAGNOSIS — I10 Essential (primary) hypertension: Secondary | ICD-10-CM

## 2020-05-24 DIAGNOSIS — H43813 Vitreous degeneration, bilateral: Secondary | ICD-10-CM | POA: Diagnosis not present

## 2020-05-24 DIAGNOSIS — H34811 Central retinal vein occlusion, right eye, with macular edema: Secondary | ICD-10-CM

## 2020-05-28 DIAGNOSIS — E1142 Type 2 diabetes mellitus with diabetic polyneuropathy: Secondary | ICD-10-CM | POA: Diagnosis not present

## 2020-05-28 DIAGNOSIS — E785 Hyperlipidemia, unspecified: Secondary | ICD-10-CM | POA: Diagnosis not present

## 2020-05-28 DIAGNOSIS — I10 Essential (primary) hypertension: Secondary | ICD-10-CM | POA: Diagnosis not present

## 2020-05-28 DIAGNOSIS — E039 Hypothyroidism, unspecified: Secondary | ICD-10-CM | POA: Diagnosis not present

## 2020-06-07 DIAGNOSIS — Z299 Encounter for prophylactic measures, unspecified: Secondary | ICD-10-CM | POA: Diagnosis not present

## 2020-06-07 DIAGNOSIS — E1165 Type 2 diabetes mellitus with hyperglycemia: Secondary | ICD-10-CM | POA: Diagnosis not present

## 2020-06-07 DIAGNOSIS — I10 Essential (primary) hypertension: Secondary | ICD-10-CM | POA: Diagnosis not present

## 2020-06-07 DIAGNOSIS — E1142 Type 2 diabetes mellitus with diabetic polyneuropathy: Secondary | ICD-10-CM | POA: Diagnosis not present

## 2020-06-21 ENCOUNTER — Encounter (INDEPENDENT_AMBULATORY_CARE_PROVIDER_SITE_OTHER): Payer: Medicare HMO | Admitting: Ophthalmology

## 2020-06-21 ENCOUNTER — Other Ambulatory Visit: Payer: Self-pay

## 2020-06-21 DIAGNOSIS — H35033 Hypertensive retinopathy, bilateral: Secondary | ICD-10-CM

## 2020-06-21 DIAGNOSIS — H34811 Central retinal vein occlusion, right eye, with macular edema: Secondary | ICD-10-CM

## 2020-06-21 DIAGNOSIS — H43813 Vitreous degeneration, bilateral: Secondary | ICD-10-CM | POA: Diagnosis not present

## 2020-06-21 DIAGNOSIS — I10 Essential (primary) hypertension: Secondary | ICD-10-CM

## 2020-07-19 ENCOUNTER — Other Ambulatory Visit: Payer: Self-pay

## 2020-07-19 ENCOUNTER — Encounter (INDEPENDENT_AMBULATORY_CARE_PROVIDER_SITE_OTHER): Payer: Medicare HMO | Admitting: Ophthalmology

## 2020-07-19 DIAGNOSIS — H34811 Central retinal vein occlusion, right eye, with macular edema: Secondary | ICD-10-CM

## 2020-07-19 DIAGNOSIS — H43813 Vitreous degeneration, bilateral: Secondary | ICD-10-CM | POA: Diagnosis not present

## 2020-07-19 DIAGNOSIS — I10 Essential (primary) hypertension: Secondary | ICD-10-CM | POA: Diagnosis not present

## 2020-07-19 DIAGNOSIS — H35033 Hypertensive retinopathy, bilateral: Secondary | ICD-10-CM

## 2020-08-16 ENCOUNTER — Encounter (INDEPENDENT_AMBULATORY_CARE_PROVIDER_SITE_OTHER): Payer: Medicare HMO | Admitting: Ophthalmology

## 2020-08-16 ENCOUNTER — Other Ambulatory Visit: Payer: Self-pay

## 2020-08-16 DIAGNOSIS — I10 Essential (primary) hypertension: Secondary | ICD-10-CM

## 2020-08-16 DIAGNOSIS — H35033 Hypertensive retinopathy, bilateral: Secondary | ICD-10-CM | POA: Diagnosis not present

## 2020-08-16 DIAGNOSIS — H43813 Vitreous degeneration, bilateral: Secondary | ICD-10-CM | POA: Diagnosis not present

## 2020-08-16 DIAGNOSIS — H34811 Central retinal vein occlusion, right eye, with macular edema: Secondary | ICD-10-CM | POA: Diagnosis not present

## 2020-08-23 DIAGNOSIS — Z23 Encounter for immunization: Secondary | ICD-10-CM | POA: Diagnosis not present

## 2020-08-27 DIAGNOSIS — E1165 Type 2 diabetes mellitus with hyperglycemia: Secondary | ICD-10-CM | POA: Diagnosis not present

## 2020-08-27 DIAGNOSIS — E039 Hypothyroidism, unspecified: Secondary | ICD-10-CM | POA: Diagnosis not present

## 2020-08-27 DIAGNOSIS — I10 Essential (primary) hypertension: Secondary | ICD-10-CM | POA: Diagnosis not present

## 2020-08-27 DIAGNOSIS — E7849 Other hyperlipidemia: Secondary | ICD-10-CM | POA: Diagnosis not present

## 2020-09-17 ENCOUNTER — Encounter (INDEPENDENT_AMBULATORY_CARE_PROVIDER_SITE_OTHER): Payer: Medicare HMO | Admitting: Ophthalmology

## 2020-09-20 ENCOUNTER — Other Ambulatory Visit: Payer: Self-pay

## 2020-09-20 ENCOUNTER — Encounter (INDEPENDENT_AMBULATORY_CARE_PROVIDER_SITE_OTHER): Payer: Medicare HMO | Admitting: Ophthalmology

## 2020-09-20 DIAGNOSIS — H35033 Hypertensive retinopathy, bilateral: Secondary | ICD-10-CM | POA: Diagnosis not present

## 2020-09-20 DIAGNOSIS — E1142 Type 2 diabetes mellitus with diabetic polyneuropathy: Secondary | ICD-10-CM | POA: Diagnosis not present

## 2020-09-20 DIAGNOSIS — M25511 Pain in right shoulder: Secondary | ICD-10-CM | POA: Diagnosis not present

## 2020-09-20 DIAGNOSIS — Z6823 Body mass index (BMI) 23.0-23.9, adult: Secondary | ICD-10-CM | POA: Diagnosis not present

## 2020-09-20 DIAGNOSIS — Z299 Encounter for prophylactic measures, unspecified: Secondary | ICD-10-CM | POA: Diagnosis not present

## 2020-09-20 DIAGNOSIS — I10 Essential (primary) hypertension: Secondary | ICD-10-CM

## 2020-09-20 DIAGNOSIS — H43813 Vitreous degeneration, bilateral: Secondary | ICD-10-CM

## 2020-09-20 DIAGNOSIS — E1165 Type 2 diabetes mellitus with hyperglycemia: Secondary | ICD-10-CM | POA: Diagnosis not present

## 2020-09-20 DIAGNOSIS — H34811 Central retinal vein occlusion, right eye, with macular edema: Secondary | ICD-10-CM | POA: Diagnosis not present

## 2020-10-18 ENCOUNTER — Other Ambulatory Visit: Payer: Self-pay

## 2020-10-18 ENCOUNTER — Encounter (INDEPENDENT_AMBULATORY_CARE_PROVIDER_SITE_OTHER): Payer: Medicare HMO | Admitting: Ophthalmology

## 2020-10-18 DIAGNOSIS — I10 Essential (primary) hypertension: Secondary | ICD-10-CM

## 2020-10-18 DIAGNOSIS — H34811 Central retinal vein occlusion, right eye, with macular edema: Secondary | ICD-10-CM

## 2020-10-18 DIAGNOSIS — H35033 Hypertensive retinopathy, bilateral: Secondary | ICD-10-CM | POA: Diagnosis not present

## 2020-10-18 DIAGNOSIS — H43813 Vitreous degeneration, bilateral: Secondary | ICD-10-CM

## 2020-10-19 DIAGNOSIS — E1165 Type 2 diabetes mellitus with hyperglycemia: Secondary | ICD-10-CM | POA: Diagnosis not present

## 2020-10-26 DIAGNOSIS — Z7189 Other specified counseling: Secondary | ICD-10-CM | POA: Diagnosis not present

## 2020-10-26 DIAGNOSIS — M160 Bilateral primary osteoarthritis of hip: Secondary | ICD-10-CM | POA: Diagnosis not present

## 2020-10-26 DIAGNOSIS — Z1339 Encounter for screening examination for other mental health and behavioral disorders: Secondary | ICD-10-CM | POA: Diagnosis not present

## 2020-10-26 DIAGNOSIS — M25551 Pain in right hip: Secondary | ICD-10-CM | POA: Diagnosis not present

## 2020-10-26 DIAGNOSIS — R5383 Other fatigue: Secondary | ICD-10-CM | POA: Diagnosis not present

## 2020-10-26 DIAGNOSIS — E7800 Pure hypercholesterolemia, unspecified: Secondary | ICD-10-CM | POA: Diagnosis not present

## 2020-10-26 DIAGNOSIS — Z0000 Encounter for general adult medical examination without abnormal findings: Secondary | ICD-10-CM | POA: Diagnosis not present

## 2020-10-26 DIAGNOSIS — Z1331 Encounter for screening for depression: Secondary | ICD-10-CM | POA: Diagnosis not present

## 2020-10-26 DIAGNOSIS — Z6824 Body mass index (BMI) 24.0-24.9, adult: Secondary | ICD-10-CM | POA: Diagnosis not present

## 2020-10-26 DIAGNOSIS — I10 Essential (primary) hypertension: Secondary | ICD-10-CM | POA: Diagnosis not present

## 2020-10-26 DIAGNOSIS — Z299 Encounter for prophylactic measures, unspecified: Secondary | ICD-10-CM | POA: Diagnosis not present

## 2020-10-29 DIAGNOSIS — I10 Essential (primary) hypertension: Secondary | ICD-10-CM | POA: Diagnosis not present

## 2020-10-29 DIAGNOSIS — E7849 Other hyperlipidemia: Secondary | ICD-10-CM | POA: Diagnosis not present

## 2020-10-29 DIAGNOSIS — E1165 Type 2 diabetes mellitus with hyperglycemia: Secondary | ICD-10-CM | POA: Diagnosis not present

## 2020-11-29 ENCOUNTER — Encounter (INDEPENDENT_AMBULATORY_CARE_PROVIDER_SITE_OTHER): Payer: Medicare HMO | Admitting: Ophthalmology

## 2020-11-29 ENCOUNTER — Other Ambulatory Visit: Payer: Self-pay

## 2020-11-29 DIAGNOSIS — E1165 Type 2 diabetes mellitus with hyperglycemia: Secondary | ICD-10-CM | POA: Diagnosis not present

## 2020-11-29 DIAGNOSIS — E7849 Other hyperlipidemia: Secondary | ICD-10-CM | POA: Diagnosis not present

## 2020-11-29 DIAGNOSIS — H34811 Central retinal vein occlusion, right eye, with macular edema: Secondary | ICD-10-CM

## 2020-11-29 DIAGNOSIS — H35033 Hypertensive retinopathy, bilateral: Secondary | ICD-10-CM

## 2020-11-29 DIAGNOSIS — I10 Essential (primary) hypertension: Secondary | ICD-10-CM

## 2020-11-29 DIAGNOSIS — H43813 Vitreous degeneration, bilateral: Secondary | ICD-10-CM | POA: Diagnosis not present

## 2020-12-16 DIAGNOSIS — H524 Presbyopia: Secondary | ICD-10-CM | POA: Diagnosis not present

## 2020-12-16 DIAGNOSIS — Z961 Presence of intraocular lens: Secondary | ICD-10-CM | POA: Diagnosis not present

## 2020-12-16 DIAGNOSIS — Z7984 Long term (current) use of oral hypoglycemic drugs: Secondary | ICD-10-CM | POA: Diagnosis not present

## 2020-12-16 DIAGNOSIS — E119 Type 2 diabetes mellitus without complications: Secondary | ICD-10-CM | POA: Diagnosis not present

## 2020-12-16 DIAGNOSIS — I10 Essential (primary) hypertension: Secondary | ICD-10-CM | POA: Diagnosis not present

## 2020-12-16 DIAGNOSIS — H348312 Tributary (branch) retinal vein occlusion, right eye, stable: Secondary | ICD-10-CM | POA: Diagnosis not present

## 2020-12-16 DIAGNOSIS — E7800 Pure hypercholesterolemia, unspecified: Secondary | ICD-10-CM | POA: Diagnosis not present

## 2021-01-10 ENCOUNTER — Other Ambulatory Visit: Payer: Self-pay

## 2021-01-10 ENCOUNTER — Encounter (INDEPENDENT_AMBULATORY_CARE_PROVIDER_SITE_OTHER): Payer: Medicare HMO | Admitting: Ophthalmology

## 2021-01-10 DIAGNOSIS — H43813 Vitreous degeneration, bilateral: Secondary | ICD-10-CM | POA: Diagnosis not present

## 2021-01-10 DIAGNOSIS — H35033 Hypertensive retinopathy, bilateral: Secondary | ICD-10-CM

## 2021-01-10 DIAGNOSIS — H34811 Central retinal vein occlusion, right eye, with macular edema: Secondary | ICD-10-CM

## 2021-01-10 DIAGNOSIS — I10 Essential (primary) hypertension: Secondary | ICD-10-CM | POA: Diagnosis not present

## 2021-01-21 DIAGNOSIS — Z20822 Contact with and (suspected) exposure to covid-19: Secondary | ICD-10-CM | POA: Diagnosis not present

## 2021-01-21 DIAGNOSIS — Z03818 Encounter for observation for suspected exposure to other biological agents ruled out: Secondary | ICD-10-CM | POA: Diagnosis not present

## 2021-02-21 ENCOUNTER — Other Ambulatory Visit: Payer: Self-pay

## 2021-02-21 ENCOUNTER — Encounter (INDEPENDENT_AMBULATORY_CARE_PROVIDER_SITE_OTHER): Payer: Medicare HMO | Admitting: Ophthalmology

## 2021-02-21 DIAGNOSIS — H34811 Central retinal vein occlusion, right eye, with macular edema: Secondary | ICD-10-CM

## 2021-02-21 DIAGNOSIS — H43813 Vitreous degeneration, bilateral: Secondary | ICD-10-CM | POA: Diagnosis not present

## 2021-02-21 DIAGNOSIS — I10 Essential (primary) hypertension: Secondary | ICD-10-CM

## 2021-02-21 DIAGNOSIS — H35033 Hypertensive retinopathy, bilateral: Secondary | ICD-10-CM

## 2021-02-25 DIAGNOSIS — E7800 Pure hypercholesterolemia, unspecified: Secondary | ICD-10-CM | POA: Diagnosis not present

## 2021-02-25 DIAGNOSIS — E039 Hypothyroidism, unspecified: Secondary | ICD-10-CM | POA: Diagnosis not present

## 2021-02-25 DIAGNOSIS — Z0000 Encounter for general adult medical examination without abnormal findings: Secondary | ICD-10-CM | POA: Diagnosis not present

## 2021-02-25 DIAGNOSIS — Z79899 Other long term (current) drug therapy: Secondary | ICD-10-CM | POA: Diagnosis not present

## 2021-02-25 DIAGNOSIS — I10 Essential (primary) hypertension: Secondary | ICD-10-CM | POA: Diagnosis not present

## 2021-02-25 DIAGNOSIS — Z1331 Encounter for screening for depression: Secondary | ICD-10-CM | POA: Diagnosis not present

## 2021-02-25 DIAGNOSIS — E1165 Type 2 diabetes mellitus with hyperglycemia: Secondary | ICD-10-CM | POA: Diagnosis not present

## 2021-02-25 DIAGNOSIS — Z789 Other specified health status: Secondary | ICD-10-CM | POA: Diagnosis not present

## 2021-02-25 DIAGNOSIS — F410 Panic disorder [episodic paroxysmal anxiety] without agoraphobia: Secondary | ICD-10-CM | POA: Diagnosis not present

## 2021-02-25 DIAGNOSIS — R5383 Other fatigue: Secondary | ICD-10-CM | POA: Diagnosis not present

## 2021-02-25 DIAGNOSIS — Z1339 Encounter for screening examination for other mental health and behavioral disorders: Secondary | ICD-10-CM | POA: Diagnosis not present

## 2021-02-25 DIAGNOSIS — Z7189 Other specified counseling: Secondary | ICD-10-CM | POA: Diagnosis not present

## 2021-02-26 DIAGNOSIS — E1165 Type 2 diabetes mellitus with hyperglycemia: Secondary | ICD-10-CM | POA: Diagnosis not present

## 2021-02-26 DIAGNOSIS — I10 Essential (primary) hypertension: Secondary | ICD-10-CM | POA: Diagnosis not present

## 2021-02-26 DIAGNOSIS — E7849 Other hyperlipidemia: Secondary | ICD-10-CM | POA: Diagnosis not present

## 2021-03-11 DIAGNOSIS — M818 Other osteoporosis without current pathological fracture: Secondary | ICD-10-CM | POA: Diagnosis not present

## 2021-03-11 DIAGNOSIS — E2839 Other primary ovarian failure: Secondary | ICD-10-CM | POA: Diagnosis not present

## 2021-03-18 DIAGNOSIS — Z1231 Encounter for screening mammogram for malignant neoplasm of breast: Secondary | ICD-10-CM | POA: Diagnosis not present

## 2021-03-18 DIAGNOSIS — Z01419 Encounter for gynecological examination (general) (routine) without abnormal findings: Secondary | ICD-10-CM | POA: Diagnosis not present

## 2021-04-04 ENCOUNTER — Other Ambulatory Visit: Payer: Self-pay

## 2021-04-04 ENCOUNTER — Encounter (INDEPENDENT_AMBULATORY_CARE_PROVIDER_SITE_OTHER): Payer: Medicare HMO | Admitting: Ophthalmology

## 2021-04-04 DIAGNOSIS — I10 Essential (primary) hypertension: Secondary | ICD-10-CM | POA: Diagnosis not present

## 2021-04-04 DIAGNOSIS — H43813 Vitreous degeneration, bilateral: Secondary | ICD-10-CM

## 2021-04-04 DIAGNOSIS — H35033 Hypertensive retinopathy, bilateral: Secondary | ICD-10-CM | POA: Diagnosis not present

## 2021-04-04 DIAGNOSIS — H34811 Central retinal vein occlusion, right eye, with macular edema: Secondary | ICD-10-CM | POA: Diagnosis not present

## 2021-04-28 DIAGNOSIS — E1165 Type 2 diabetes mellitus with hyperglycemia: Secondary | ICD-10-CM | POA: Diagnosis not present

## 2021-04-28 DIAGNOSIS — E7849 Other hyperlipidemia: Secondary | ICD-10-CM | POA: Diagnosis not present

## 2021-04-28 DIAGNOSIS — I10 Essential (primary) hypertension: Secondary | ICD-10-CM | POA: Diagnosis not present

## 2021-05-16 ENCOUNTER — Other Ambulatory Visit: Payer: Self-pay

## 2021-05-16 ENCOUNTER — Encounter (INDEPENDENT_AMBULATORY_CARE_PROVIDER_SITE_OTHER): Payer: Medicare HMO | Admitting: Ophthalmology

## 2021-05-16 DIAGNOSIS — H34811 Central retinal vein occlusion, right eye, with macular edema: Secondary | ICD-10-CM

## 2021-05-16 DIAGNOSIS — H35033 Hypertensive retinopathy, bilateral: Secondary | ICD-10-CM | POA: Diagnosis not present

## 2021-05-16 DIAGNOSIS — H43813 Vitreous degeneration, bilateral: Secondary | ICD-10-CM

## 2021-05-16 DIAGNOSIS — I10 Essential (primary) hypertension: Secondary | ICD-10-CM

## 2021-06-01 DIAGNOSIS — Z299 Encounter for prophylactic measures, unspecified: Secondary | ICD-10-CM | POA: Diagnosis not present

## 2021-06-01 DIAGNOSIS — E1165 Type 2 diabetes mellitus with hyperglycemia: Secondary | ICD-10-CM | POA: Diagnosis not present

## 2021-06-01 DIAGNOSIS — E11319 Type 2 diabetes mellitus with unspecified diabetic retinopathy without macular edema: Secondary | ICD-10-CM | POA: Diagnosis not present

## 2021-06-01 DIAGNOSIS — I10 Essential (primary) hypertension: Secondary | ICD-10-CM | POA: Diagnosis not present

## 2021-06-16 ENCOUNTER — Other Ambulatory Visit: Payer: Self-pay

## 2021-06-16 ENCOUNTER — Encounter (INDEPENDENT_AMBULATORY_CARE_PROVIDER_SITE_OTHER): Payer: Medicare HMO | Admitting: Ophthalmology

## 2021-06-16 DIAGNOSIS — H348112 Central retinal vein occlusion, right eye, stable: Secondary | ICD-10-CM

## 2021-06-16 DIAGNOSIS — I10 Essential (primary) hypertension: Secondary | ICD-10-CM

## 2021-06-16 DIAGNOSIS — H35033 Hypertensive retinopathy, bilateral: Secondary | ICD-10-CM | POA: Diagnosis not present

## 2021-06-16 DIAGNOSIS — H43813 Vitreous degeneration, bilateral: Secondary | ICD-10-CM

## 2021-06-20 ENCOUNTER — Encounter (INDEPENDENT_AMBULATORY_CARE_PROVIDER_SITE_OTHER): Payer: Medicare HMO | Admitting: Ophthalmology

## 2021-06-22 DIAGNOSIS — I10 Essential (primary) hypertension: Secondary | ICD-10-CM | POA: Diagnosis not present

## 2021-06-22 DIAGNOSIS — M79675 Pain in left toe(s): Secondary | ICD-10-CM | POA: Diagnosis not present

## 2021-06-22 DIAGNOSIS — M7989 Other specified soft tissue disorders: Secondary | ICD-10-CM | POA: Diagnosis not present

## 2021-06-22 DIAGNOSIS — E782 Mixed hyperlipidemia: Secondary | ICD-10-CM | POA: Diagnosis not present

## 2021-06-22 DIAGNOSIS — E119 Type 2 diabetes mellitus without complications: Secondary | ICD-10-CM | POA: Diagnosis not present

## 2021-06-29 DIAGNOSIS — I10 Essential (primary) hypertension: Secondary | ICD-10-CM | POA: Diagnosis not present

## 2021-06-29 DIAGNOSIS — E7849 Other hyperlipidemia: Secondary | ICD-10-CM | POA: Diagnosis not present

## 2021-06-29 DIAGNOSIS — E1165 Type 2 diabetes mellitus with hyperglycemia: Secondary | ICD-10-CM | POA: Diagnosis not present

## 2021-07-21 ENCOUNTER — Other Ambulatory Visit: Payer: Self-pay

## 2021-07-21 ENCOUNTER — Encounter (INDEPENDENT_AMBULATORY_CARE_PROVIDER_SITE_OTHER): Payer: Medicare HMO | Admitting: Ophthalmology

## 2021-07-21 DIAGNOSIS — H43813 Vitreous degeneration, bilateral: Secondary | ICD-10-CM

## 2021-07-21 DIAGNOSIS — H35033 Hypertensive retinopathy, bilateral: Secondary | ICD-10-CM | POA: Diagnosis not present

## 2021-07-21 DIAGNOSIS — I10 Essential (primary) hypertension: Secondary | ICD-10-CM

## 2021-07-21 DIAGNOSIS — H34811 Central retinal vein occlusion, right eye, with macular edema: Secondary | ICD-10-CM | POA: Diagnosis not present

## 2021-08-15 DIAGNOSIS — Z23 Encounter for immunization: Secondary | ICD-10-CM | POA: Diagnosis not present

## 2021-08-29 ENCOUNTER — Encounter (INDEPENDENT_AMBULATORY_CARE_PROVIDER_SITE_OTHER): Payer: Medicare HMO | Admitting: Ophthalmology

## 2021-08-29 ENCOUNTER — Other Ambulatory Visit: Payer: Self-pay

## 2021-08-29 DIAGNOSIS — H34811 Central retinal vein occlusion, right eye, with macular edema: Secondary | ICD-10-CM

## 2021-08-29 DIAGNOSIS — H35033 Hypertensive retinopathy, bilateral: Secondary | ICD-10-CM

## 2021-08-29 DIAGNOSIS — H43813 Vitreous degeneration, bilateral: Secondary | ICD-10-CM | POA: Diagnosis not present

## 2021-08-29 DIAGNOSIS — I10 Essential (primary) hypertension: Secondary | ICD-10-CM

## 2021-08-29 DIAGNOSIS — E039 Hypothyroidism, unspecified: Secondary | ICD-10-CM | POA: Diagnosis not present

## 2021-09-01 ENCOUNTER — Encounter (INDEPENDENT_AMBULATORY_CARE_PROVIDER_SITE_OTHER): Payer: Medicare HMO | Admitting: Ophthalmology

## 2021-09-13 DIAGNOSIS — I10 Essential (primary) hypertension: Secondary | ICD-10-CM | POA: Diagnosis not present

## 2021-09-13 DIAGNOSIS — Z299 Encounter for prophylactic measures, unspecified: Secondary | ICD-10-CM | POA: Diagnosis not present

## 2021-09-13 DIAGNOSIS — R109 Unspecified abdominal pain: Secondary | ICD-10-CM | POA: Diagnosis not present

## 2021-09-13 DIAGNOSIS — R42 Dizziness and giddiness: Secondary | ICD-10-CM | POA: Diagnosis not present

## 2021-09-13 DIAGNOSIS — E1165 Type 2 diabetes mellitus with hyperglycemia: Secondary | ICD-10-CM | POA: Diagnosis not present

## 2021-09-16 DIAGNOSIS — Z299 Encounter for prophylactic measures, unspecified: Secondary | ICD-10-CM | POA: Diagnosis not present

## 2021-09-16 DIAGNOSIS — E1165 Type 2 diabetes mellitus with hyperglycemia: Secondary | ICD-10-CM | POA: Diagnosis not present

## 2021-09-16 DIAGNOSIS — E1142 Type 2 diabetes mellitus with diabetic polyneuropathy: Secondary | ICD-10-CM | POA: Diagnosis not present

## 2021-09-16 DIAGNOSIS — I10 Essential (primary) hypertension: Secondary | ICD-10-CM | POA: Diagnosis not present

## 2021-09-16 DIAGNOSIS — F32A Depression, unspecified: Secondary | ICD-10-CM | POA: Diagnosis not present

## 2021-10-10 ENCOUNTER — Other Ambulatory Visit: Payer: Self-pay

## 2021-10-10 ENCOUNTER — Encounter (INDEPENDENT_AMBULATORY_CARE_PROVIDER_SITE_OTHER): Payer: Medicare HMO | Admitting: Ophthalmology

## 2021-10-10 DIAGNOSIS — H34811 Central retinal vein occlusion, right eye, with macular edema: Secondary | ICD-10-CM | POA: Diagnosis not present

## 2021-10-10 DIAGNOSIS — I10 Essential (primary) hypertension: Secondary | ICD-10-CM

## 2021-10-10 DIAGNOSIS — H43813 Vitreous degeneration, bilateral: Secondary | ICD-10-CM | POA: Diagnosis not present

## 2021-10-10 DIAGNOSIS — H35033 Hypertensive retinopathy, bilateral: Secondary | ICD-10-CM | POA: Diagnosis not present

## 2021-10-28 DIAGNOSIS — I10 Essential (primary) hypertension: Secondary | ICD-10-CM | POA: Diagnosis not present

## 2021-10-28 DIAGNOSIS — E7800 Pure hypercholesterolemia, unspecified: Secondary | ICD-10-CM | POA: Diagnosis not present

## 2021-10-28 DIAGNOSIS — F32A Depression, unspecified: Secondary | ICD-10-CM | POA: Diagnosis not present

## 2021-11-28 ENCOUNTER — Other Ambulatory Visit: Payer: Self-pay

## 2021-11-28 ENCOUNTER — Encounter (INDEPENDENT_AMBULATORY_CARE_PROVIDER_SITE_OTHER): Payer: Medicare HMO | Admitting: Ophthalmology

## 2021-11-28 DIAGNOSIS — H35033 Hypertensive retinopathy, bilateral: Secondary | ICD-10-CM | POA: Diagnosis not present

## 2021-11-28 DIAGNOSIS — H34811 Central retinal vein occlusion, right eye, with macular edema: Secondary | ICD-10-CM

## 2021-11-28 DIAGNOSIS — I10 Essential (primary) hypertension: Secondary | ICD-10-CM

## 2021-11-28 DIAGNOSIS — H43813 Vitreous degeneration, bilateral: Secondary | ICD-10-CM | POA: Diagnosis not present

## 2021-11-29 DIAGNOSIS — E7800 Pure hypercholesterolemia, unspecified: Secondary | ICD-10-CM | POA: Diagnosis not present

## 2021-11-29 DIAGNOSIS — F32A Depression, unspecified: Secondary | ICD-10-CM | POA: Diagnosis not present

## 2021-11-29 DIAGNOSIS — I10 Essential (primary) hypertension: Secondary | ICD-10-CM | POA: Diagnosis not present

## 2021-12-02 DIAGNOSIS — Z0000 Encounter for general adult medical examination without abnormal findings: Secondary | ICD-10-CM | POA: Diagnosis not present

## 2021-12-02 DIAGNOSIS — Z299 Encounter for prophylactic measures, unspecified: Secondary | ICD-10-CM | POA: Diagnosis not present

## 2021-12-02 DIAGNOSIS — Z1339 Encounter for screening examination for other mental health and behavioral disorders: Secondary | ICD-10-CM | POA: Diagnosis not present

## 2021-12-02 DIAGNOSIS — R5383 Other fatigue: Secondary | ICD-10-CM | POA: Diagnosis not present

## 2021-12-02 DIAGNOSIS — E7800 Pure hypercholesterolemia, unspecified: Secondary | ICD-10-CM | POA: Diagnosis not present

## 2021-12-02 DIAGNOSIS — Z7189 Other specified counseling: Secondary | ICD-10-CM | POA: Diagnosis not present

## 2021-12-02 DIAGNOSIS — I10 Essential (primary) hypertension: Secondary | ICD-10-CM | POA: Diagnosis not present

## 2021-12-02 DIAGNOSIS — Z6826 Body mass index (BMI) 26.0-26.9, adult: Secondary | ICD-10-CM | POA: Diagnosis not present

## 2021-12-02 DIAGNOSIS — Z1331 Encounter for screening for depression: Secondary | ICD-10-CM | POA: Diagnosis not present

## 2021-12-20 DIAGNOSIS — Z299 Encounter for prophylactic measures, unspecified: Secondary | ICD-10-CM | POA: Diagnosis not present

## 2021-12-20 DIAGNOSIS — E039 Hypothyroidism, unspecified: Secondary | ICD-10-CM | POA: Diagnosis not present

## 2021-12-20 DIAGNOSIS — R5383 Other fatigue: Secondary | ICD-10-CM | POA: Diagnosis not present

## 2021-12-20 DIAGNOSIS — E7800 Pure hypercholesterolemia, unspecified: Secondary | ICD-10-CM | POA: Diagnosis not present

## 2021-12-20 DIAGNOSIS — E559 Vitamin D deficiency, unspecified: Secondary | ICD-10-CM | POA: Diagnosis not present

## 2021-12-20 DIAGNOSIS — Z6827 Body mass index (BMI) 27.0-27.9, adult: Secondary | ICD-10-CM | POA: Diagnosis not present

## 2021-12-20 DIAGNOSIS — R809 Proteinuria, unspecified: Secondary | ICD-10-CM | POA: Diagnosis not present

## 2021-12-20 DIAGNOSIS — E1129 Type 2 diabetes mellitus with other diabetic kidney complication: Secondary | ICD-10-CM | POA: Diagnosis not present

## 2021-12-20 DIAGNOSIS — I10 Essential (primary) hypertension: Secondary | ICD-10-CM | POA: Diagnosis not present

## 2021-12-20 DIAGNOSIS — M25562 Pain in left knee: Secondary | ICD-10-CM | POA: Diagnosis not present

## 2021-12-20 DIAGNOSIS — E1165 Type 2 diabetes mellitus with hyperglycemia: Secondary | ICD-10-CM | POA: Diagnosis not present

## 2021-12-20 DIAGNOSIS — Z79899 Other long term (current) drug therapy: Secondary | ICD-10-CM | POA: Diagnosis not present

## 2022-01-16 ENCOUNTER — Encounter (INDEPENDENT_AMBULATORY_CARE_PROVIDER_SITE_OTHER): Payer: Medicare HMO | Admitting: Ophthalmology

## 2022-01-16 ENCOUNTER — Other Ambulatory Visit: Payer: Self-pay

## 2022-01-16 DIAGNOSIS — H43813 Vitreous degeneration, bilateral: Secondary | ICD-10-CM | POA: Diagnosis not present

## 2022-01-16 DIAGNOSIS — I10 Essential (primary) hypertension: Secondary | ICD-10-CM | POA: Diagnosis not present

## 2022-01-16 DIAGNOSIS — H34811 Central retinal vein occlusion, right eye, with macular edema: Secondary | ICD-10-CM | POA: Diagnosis not present

## 2022-01-16 DIAGNOSIS — H35033 Hypertensive retinopathy, bilateral: Secondary | ICD-10-CM

## 2022-01-18 DIAGNOSIS — Z7984 Long term (current) use of oral hypoglycemic drugs: Secondary | ICD-10-CM | POA: Diagnosis not present

## 2022-01-18 DIAGNOSIS — E119 Type 2 diabetes mellitus without complications: Secondary | ICD-10-CM | POA: Diagnosis not present

## 2022-01-18 DIAGNOSIS — H348312 Tributary (branch) retinal vein occlusion, right eye, stable: Secondary | ICD-10-CM | POA: Diagnosis not present

## 2022-01-18 DIAGNOSIS — Z961 Presence of intraocular lens: Secondary | ICD-10-CM | POA: Diagnosis not present

## 2022-02-03 DIAGNOSIS — H524 Presbyopia: Secondary | ICD-10-CM | POA: Diagnosis not present

## 2022-02-27 ENCOUNTER — Encounter (INDEPENDENT_AMBULATORY_CARE_PROVIDER_SITE_OTHER): Payer: Medicare HMO | Admitting: Ophthalmology

## 2022-02-27 DIAGNOSIS — H35033 Hypertensive retinopathy, bilateral: Secondary | ICD-10-CM

## 2022-02-27 DIAGNOSIS — H43813 Vitreous degeneration, bilateral: Secondary | ICD-10-CM | POA: Diagnosis not present

## 2022-02-27 DIAGNOSIS — I10 Essential (primary) hypertension: Secondary | ICD-10-CM | POA: Diagnosis not present

## 2022-02-27 DIAGNOSIS — H34811 Central retinal vein occlusion, right eye, with macular edema: Secondary | ICD-10-CM | POA: Diagnosis not present

## 2022-03-22 DIAGNOSIS — E1142 Type 2 diabetes mellitus with diabetic polyneuropathy: Secondary | ICD-10-CM | POA: Diagnosis not present

## 2022-03-22 DIAGNOSIS — I10 Essential (primary) hypertension: Secondary | ICD-10-CM | POA: Diagnosis not present

## 2022-03-22 DIAGNOSIS — Z299 Encounter for prophylactic measures, unspecified: Secondary | ICD-10-CM | POA: Diagnosis not present

## 2022-03-22 DIAGNOSIS — E1165 Type 2 diabetes mellitus with hyperglycemia: Secondary | ICD-10-CM | POA: Diagnosis not present

## 2022-03-22 DIAGNOSIS — Z87891 Personal history of nicotine dependence: Secondary | ICD-10-CM | POA: Diagnosis not present

## 2022-04-17 ENCOUNTER — Encounter (INDEPENDENT_AMBULATORY_CARE_PROVIDER_SITE_OTHER): Payer: Medicare HMO | Admitting: Ophthalmology

## 2022-04-17 DIAGNOSIS — I10 Essential (primary) hypertension: Secondary | ICD-10-CM

## 2022-04-17 DIAGNOSIS — H35033 Hypertensive retinopathy, bilateral: Secondary | ICD-10-CM

## 2022-04-17 DIAGNOSIS — H43813 Vitreous degeneration, bilateral: Secondary | ICD-10-CM | POA: Diagnosis not present

## 2022-04-17 DIAGNOSIS — H34811 Central retinal vein occlusion, right eye, with macular edema: Secondary | ICD-10-CM | POA: Diagnosis not present

## 2022-05-26 DIAGNOSIS — Z01419 Encounter for gynecological examination (general) (routine) without abnormal findings: Secondary | ICD-10-CM | POA: Diagnosis not present

## 2022-05-26 DIAGNOSIS — Z1231 Encounter for screening mammogram for malignant neoplasm of breast: Secondary | ICD-10-CM | POA: Diagnosis not present

## 2022-05-30 ENCOUNTER — Other Ambulatory Visit: Payer: Self-pay | Admitting: Obstetrics and Gynecology

## 2022-05-30 DIAGNOSIS — R928 Other abnormal and inconclusive findings on diagnostic imaging of breast: Secondary | ICD-10-CM

## 2022-06-12 ENCOUNTER — Other Ambulatory Visit: Payer: Medicare HMO

## 2022-06-12 ENCOUNTER — Ambulatory Visit
Admission: RE | Admit: 2022-06-12 | Discharge: 2022-06-12 | Disposition: A | Discharge status: Home / Self Care | Payer: Medicare HMO | Source: Ambulatory Visit | Attending: Obstetrics and Gynecology | Admitting: Obstetrics and Gynecology

## 2022-06-12 ENCOUNTER — Encounter (INDEPENDENT_AMBULATORY_CARE_PROVIDER_SITE_OTHER): Payer: Medicare HMO | Admitting: Ophthalmology

## 2022-06-12 DIAGNOSIS — I10 Essential (primary) hypertension: Secondary | ICD-10-CM

## 2022-06-12 DIAGNOSIS — H35033 Hypertensive retinopathy, bilateral: Secondary | ICD-10-CM | POA: Diagnosis not present

## 2022-06-12 DIAGNOSIS — H43813 Vitreous degeneration, bilateral: Secondary | ICD-10-CM

## 2022-06-12 DIAGNOSIS — H34811 Central retinal vein occlusion, right eye, with macular edema: Secondary | ICD-10-CM | POA: Diagnosis not present

## 2022-06-12 DIAGNOSIS — R928 Other abnormal and inconclusive findings on diagnostic imaging of breast: Secondary | ICD-10-CM

## 2022-06-12 DIAGNOSIS — N6002 Solitary cyst of left breast: Secondary | ICD-10-CM | POA: Diagnosis not present

## 2022-06-28 DIAGNOSIS — Z789 Other specified health status: Secondary | ICD-10-CM | POA: Diagnosis not present

## 2022-06-28 DIAGNOSIS — I10 Essential (primary) hypertension: Secondary | ICD-10-CM | POA: Diagnosis not present

## 2022-06-28 DIAGNOSIS — E039 Hypothyroidism, unspecified: Secondary | ICD-10-CM | POA: Diagnosis not present

## 2022-06-28 DIAGNOSIS — Z299 Encounter for prophylactic measures, unspecified: Secondary | ICD-10-CM | POA: Diagnosis not present

## 2022-06-28 DIAGNOSIS — Z6826 Body mass index (BMI) 26.0-26.9, adult: Secondary | ICD-10-CM | POA: Diagnosis not present

## 2022-06-28 DIAGNOSIS — E1165 Type 2 diabetes mellitus with hyperglycemia: Secondary | ICD-10-CM | POA: Diagnosis not present

## 2022-06-29 DIAGNOSIS — E1165 Type 2 diabetes mellitus with hyperglycemia: Secondary | ICD-10-CM | POA: Diagnosis not present

## 2022-06-29 DIAGNOSIS — E7849 Other hyperlipidemia: Secondary | ICD-10-CM | POA: Diagnosis not present

## 2022-06-29 DIAGNOSIS — I10 Essential (primary) hypertension: Secondary | ICD-10-CM | POA: Diagnosis not present

## 2022-07-26 DIAGNOSIS — I10 Essential (primary) hypertension: Secondary | ICD-10-CM | POA: Diagnosis not present

## 2022-07-26 DIAGNOSIS — G4700 Insomnia, unspecified: Secondary | ICD-10-CM | POA: Diagnosis not present

## 2022-07-26 DIAGNOSIS — E1142 Type 2 diabetes mellitus with diabetic polyneuropathy: Secondary | ICD-10-CM | POA: Diagnosis not present

## 2022-07-26 DIAGNOSIS — Z23 Encounter for immunization: Secondary | ICD-10-CM | POA: Diagnosis not present

## 2022-07-26 DIAGNOSIS — Z299 Encounter for prophylactic measures, unspecified: Secondary | ICD-10-CM | POA: Diagnosis not present

## 2022-07-26 DIAGNOSIS — Z789 Other specified health status: Secondary | ICD-10-CM | POA: Diagnosis not present

## 2022-07-31 ENCOUNTER — Encounter (INDEPENDENT_AMBULATORY_CARE_PROVIDER_SITE_OTHER): Payer: Medicare HMO | Admitting: Ophthalmology

## 2022-07-31 DIAGNOSIS — H35033 Hypertensive retinopathy, bilateral: Secondary | ICD-10-CM

## 2022-07-31 DIAGNOSIS — H43813 Vitreous degeneration, bilateral: Secondary | ICD-10-CM

## 2022-07-31 DIAGNOSIS — H34811 Central retinal vein occlusion, right eye, with macular edema: Secondary | ICD-10-CM

## 2022-07-31 DIAGNOSIS — I10 Essential (primary) hypertension: Secondary | ICD-10-CM

## 2022-09-18 ENCOUNTER — Encounter (INDEPENDENT_AMBULATORY_CARE_PROVIDER_SITE_OTHER): Payer: Medicare HMO | Admitting: Ophthalmology

## 2022-09-18 DIAGNOSIS — I10 Essential (primary) hypertension: Secondary | ICD-10-CM

## 2022-09-18 DIAGNOSIS — H35033 Hypertensive retinopathy, bilateral: Secondary | ICD-10-CM | POA: Diagnosis not present

## 2022-09-18 DIAGNOSIS — H34811 Central retinal vein occlusion, right eye, with macular edema: Secondary | ICD-10-CM

## 2022-09-18 DIAGNOSIS — H43813 Vitreous degeneration, bilateral: Secondary | ICD-10-CM

## 2022-10-02 DIAGNOSIS — E1165 Type 2 diabetes mellitus with hyperglycemia: Secondary | ICD-10-CM | POA: Diagnosis not present

## 2022-10-02 DIAGNOSIS — Z299 Encounter for prophylactic measures, unspecified: Secondary | ICD-10-CM | POA: Diagnosis not present

## 2022-10-02 DIAGNOSIS — E039 Hypothyroidism, unspecified: Secondary | ICD-10-CM | POA: Diagnosis not present

## 2022-10-02 DIAGNOSIS — I10 Essential (primary) hypertension: Secondary | ICD-10-CM | POA: Diagnosis not present

## 2022-11-06 ENCOUNTER — Encounter (INDEPENDENT_AMBULATORY_CARE_PROVIDER_SITE_OTHER): Payer: Medicare HMO | Admitting: Ophthalmology

## 2022-11-06 DIAGNOSIS — H34811 Central retinal vein occlusion, right eye, with macular edema: Secondary | ICD-10-CM | POA: Diagnosis not present

## 2022-11-06 DIAGNOSIS — I10 Essential (primary) hypertension: Secondary | ICD-10-CM

## 2022-11-06 DIAGNOSIS — H43813 Vitreous degeneration, bilateral: Secondary | ICD-10-CM | POA: Diagnosis not present

## 2022-11-06 DIAGNOSIS — H35033 Hypertensive retinopathy, bilateral: Secondary | ICD-10-CM

## 2022-12-25 ENCOUNTER — Encounter (INDEPENDENT_AMBULATORY_CARE_PROVIDER_SITE_OTHER): Payer: Medicare HMO | Admitting: Ophthalmology

## 2022-12-25 DIAGNOSIS — I10 Essential (primary) hypertension: Secondary | ICD-10-CM | POA: Diagnosis not present

## 2022-12-25 DIAGNOSIS — H43813 Vitreous degeneration, bilateral: Secondary | ICD-10-CM | POA: Diagnosis not present

## 2022-12-25 DIAGNOSIS — H35033 Hypertensive retinopathy, bilateral: Secondary | ICD-10-CM | POA: Diagnosis not present

## 2022-12-25 DIAGNOSIS — H34811 Central retinal vein occlusion, right eye, with macular edema: Secondary | ICD-10-CM

## 2022-12-29 DIAGNOSIS — Z299 Encounter for prophylactic measures, unspecified: Secondary | ICD-10-CM | POA: Diagnosis not present

## 2022-12-29 DIAGNOSIS — I10 Essential (primary) hypertension: Secondary | ICD-10-CM | POA: Diagnosis not present

## 2022-12-29 DIAGNOSIS — Z7189 Other specified counseling: Secondary | ICD-10-CM | POA: Diagnosis not present

## 2022-12-29 DIAGNOSIS — Z1339 Encounter for screening examination for other mental health and behavioral disorders: Secondary | ICD-10-CM | POA: Diagnosis not present

## 2022-12-29 DIAGNOSIS — Z1331 Encounter for screening for depression: Secondary | ICD-10-CM | POA: Diagnosis not present

## 2022-12-29 DIAGNOSIS — E039 Hypothyroidism, unspecified: Secondary | ICD-10-CM | POA: Diagnosis not present

## 2022-12-29 DIAGNOSIS — Z0000 Encounter for general adult medical examination without abnormal findings: Secondary | ICD-10-CM | POA: Diagnosis not present

## 2022-12-29 DIAGNOSIS — E559 Vitamin D deficiency, unspecified: Secondary | ICD-10-CM | POA: Diagnosis not present

## 2022-12-29 DIAGNOSIS — E1165 Type 2 diabetes mellitus with hyperglycemia: Secondary | ICD-10-CM | POA: Diagnosis not present

## 2023-01-05 DIAGNOSIS — Z299 Encounter for prophylactic measures, unspecified: Secondary | ICD-10-CM | POA: Diagnosis not present

## 2023-01-05 DIAGNOSIS — I10 Essential (primary) hypertension: Secondary | ICD-10-CM | POA: Diagnosis not present

## 2023-01-05 DIAGNOSIS — E1142 Type 2 diabetes mellitus with diabetic polyneuropathy: Secondary | ICD-10-CM | POA: Diagnosis not present

## 2023-01-05 DIAGNOSIS — M810 Age-related osteoporosis without current pathological fracture: Secondary | ICD-10-CM | POA: Diagnosis not present

## 2023-01-05 DIAGNOSIS — E1165 Type 2 diabetes mellitus with hyperglycemia: Secondary | ICD-10-CM | POA: Diagnosis not present

## 2023-02-01 DIAGNOSIS — Z0000 Encounter for general adult medical examination without abnormal findings: Secondary | ICD-10-CM | POA: Diagnosis not present

## 2023-02-01 DIAGNOSIS — R5383 Other fatigue: Secondary | ICD-10-CM | POA: Diagnosis not present

## 2023-02-01 DIAGNOSIS — I10 Essential (primary) hypertension: Secondary | ICD-10-CM | POA: Diagnosis not present

## 2023-02-01 DIAGNOSIS — Z299 Encounter for prophylactic measures, unspecified: Secondary | ICD-10-CM | POA: Diagnosis not present

## 2023-02-01 DIAGNOSIS — Z1331 Encounter for screening for depression: Secondary | ICD-10-CM | POA: Diagnosis not present

## 2023-02-01 DIAGNOSIS — E039 Hypothyroidism, unspecified: Secondary | ICD-10-CM | POA: Diagnosis not present

## 2023-02-19 ENCOUNTER — Encounter (INDEPENDENT_AMBULATORY_CARE_PROVIDER_SITE_OTHER): Payer: Medicare HMO | Admitting: Ophthalmology

## 2023-02-19 DIAGNOSIS — H35033 Hypertensive retinopathy, bilateral: Secondary | ICD-10-CM | POA: Diagnosis not present

## 2023-02-19 DIAGNOSIS — I10 Essential (primary) hypertension: Secondary | ICD-10-CM | POA: Diagnosis not present

## 2023-02-19 DIAGNOSIS — H43813 Vitreous degeneration, bilateral: Secondary | ICD-10-CM | POA: Diagnosis not present

## 2023-02-19 DIAGNOSIS — H34811 Central retinal vein occlusion, right eye, with macular edema: Secondary | ICD-10-CM | POA: Diagnosis not present

## 2023-02-21 DIAGNOSIS — Z7984 Long term (current) use of oral hypoglycemic drugs: Secondary | ICD-10-CM | POA: Diagnosis not present

## 2023-02-21 DIAGNOSIS — E119 Type 2 diabetes mellitus without complications: Secondary | ICD-10-CM | POA: Diagnosis not present

## 2023-02-21 DIAGNOSIS — H348312 Tributary (branch) retinal vein occlusion, right eye, stable: Secondary | ICD-10-CM | POA: Diagnosis not present

## 2023-02-21 DIAGNOSIS — Z961 Presence of intraocular lens: Secondary | ICD-10-CM | POA: Diagnosis not present

## 2023-02-26 DIAGNOSIS — H524 Presbyopia: Secondary | ICD-10-CM | POA: Diagnosis not present

## 2023-03-30 DIAGNOSIS — E1129 Type 2 diabetes mellitus with other diabetic kidney complication: Secondary | ICD-10-CM | POA: Diagnosis not present

## 2023-03-30 DIAGNOSIS — Z299 Encounter for prophylactic measures, unspecified: Secondary | ICD-10-CM | POA: Diagnosis not present

## 2023-03-30 DIAGNOSIS — E11319 Type 2 diabetes mellitus with unspecified diabetic retinopathy without macular edema: Secondary | ICD-10-CM | POA: Diagnosis not present

## 2023-03-30 DIAGNOSIS — R809 Proteinuria, unspecified: Secondary | ICD-10-CM | POA: Diagnosis not present

## 2023-03-30 DIAGNOSIS — I10 Essential (primary) hypertension: Secondary | ICD-10-CM | POA: Diagnosis not present

## 2023-03-30 DIAGNOSIS — E1142 Type 2 diabetes mellitus with diabetic polyneuropathy: Secondary | ICD-10-CM | POA: Diagnosis not present

## 2023-04-10 DIAGNOSIS — I10 Essential (primary) hypertension: Secondary | ICD-10-CM | POA: Diagnosis not present

## 2023-04-10 DIAGNOSIS — E1165 Type 2 diabetes mellitus with hyperglycemia: Secondary | ICD-10-CM | POA: Diagnosis not present

## 2023-04-10 DIAGNOSIS — Z299 Encounter for prophylactic measures, unspecified: Secondary | ICD-10-CM | POA: Diagnosis not present

## 2023-04-10 DIAGNOSIS — E1142 Type 2 diabetes mellitus with diabetic polyneuropathy: Secondary | ICD-10-CM | POA: Diagnosis not present

## 2023-04-13 DIAGNOSIS — E2839 Other primary ovarian failure: Secondary | ICD-10-CM | POA: Diagnosis not present

## 2023-04-13 DIAGNOSIS — M818 Other osteoporosis without current pathological fracture: Secondary | ICD-10-CM | POA: Diagnosis not present

## 2023-04-23 ENCOUNTER — Encounter (INDEPENDENT_AMBULATORY_CARE_PROVIDER_SITE_OTHER): Payer: Medicare HMO | Admitting: Ophthalmology

## 2023-04-23 DIAGNOSIS — H43813 Vitreous degeneration, bilateral: Secondary | ICD-10-CM | POA: Diagnosis not present

## 2023-04-23 DIAGNOSIS — H35033 Hypertensive retinopathy, bilateral: Secondary | ICD-10-CM | POA: Diagnosis not present

## 2023-04-23 DIAGNOSIS — I10 Essential (primary) hypertension: Secondary | ICD-10-CM | POA: Diagnosis not present

## 2023-04-23 DIAGNOSIS — H34811 Central retinal vein occlusion, right eye, with macular edema: Secondary | ICD-10-CM | POA: Diagnosis not present

## 2023-06-06 DIAGNOSIS — Z1231 Encounter for screening mammogram for malignant neoplasm of breast: Secondary | ICD-10-CM | POA: Diagnosis not present

## 2023-06-07 DIAGNOSIS — I10 Essential (primary) hypertension: Secondary | ICD-10-CM | POA: Diagnosis not present

## 2023-06-07 DIAGNOSIS — E538 Deficiency of other specified B group vitamins: Secondary | ICD-10-CM | POA: Diagnosis not present

## 2023-06-07 DIAGNOSIS — E785 Hyperlipidemia, unspecified: Secondary | ICD-10-CM | POA: Diagnosis not present

## 2023-06-07 DIAGNOSIS — Z299 Encounter for prophylactic measures, unspecified: Secondary | ICD-10-CM | POA: Diagnosis not present

## 2023-06-07 DIAGNOSIS — E559 Vitamin D deficiency, unspecified: Secondary | ICD-10-CM | POA: Diagnosis not present

## 2023-06-07 DIAGNOSIS — R5383 Other fatigue: Secondary | ICD-10-CM | POA: Diagnosis not present

## 2023-06-07 DIAGNOSIS — F32A Depression, unspecified: Secondary | ICD-10-CM | POA: Diagnosis not present

## 2023-07-09 ENCOUNTER — Encounter (INDEPENDENT_AMBULATORY_CARE_PROVIDER_SITE_OTHER): Payer: Medicare HMO | Admitting: Ophthalmology

## 2023-07-09 DIAGNOSIS — I10 Essential (primary) hypertension: Secondary | ICD-10-CM | POA: Diagnosis not present

## 2023-07-09 DIAGNOSIS — H43813 Vitreous degeneration, bilateral: Secondary | ICD-10-CM | POA: Diagnosis not present

## 2023-07-09 DIAGNOSIS — H35033 Hypertensive retinopathy, bilateral: Secondary | ICD-10-CM

## 2023-07-09 DIAGNOSIS — H34811 Central retinal vein occlusion, right eye, with macular edema: Secondary | ICD-10-CM | POA: Diagnosis not present

## 2023-07-16 DIAGNOSIS — E1165 Type 2 diabetes mellitus with hyperglycemia: Secondary | ICD-10-CM | POA: Diagnosis not present

## 2023-07-16 DIAGNOSIS — Z23 Encounter for immunization: Secondary | ICD-10-CM | POA: Diagnosis not present

## 2023-07-16 DIAGNOSIS — I10 Essential (primary) hypertension: Secondary | ICD-10-CM | POA: Diagnosis not present

## 2023-07-16 DIAGNOSIS — Z299 Encounter for prophylactic measures, unspecified: Secondary | ICD-10-CM | POA: Diagnosis not present

## 2023-10-01 ENCOUNTER — Encounter (INDEPENDENT_AMBULATORY_CARE_PROVIDER_SITE_OTHER): Payer: Medicare HMO | Admitting: Ophthalmology

## 2023-10-01 DIAGNOSIS — H34811 Central retinal vein occlusion, right eye, with macular edema: Secondary | ICD-10-CM

## 2023-10-01 DIAGNOSIS — H35033 Hypertensive retinopathy, bilateral: Secondary | ICD-10-CM

## 2023-10-01 DIAGNOSIS — I10 Essential (primary) hypertension: Secondary | ICD-10-CM | POA: Diagnosis not present

## 2023-10-01 DIAGNOSIS — H43813 Vitreous degeneration, bilateral: Secondary | ICD-10-CM | POA: Diagnosis not present

## 2023-10-19 DIAGNOSIS — Z299 Encounter for prophylactic measures, unspecified: Secondary | ICD-10-CM | POA: Diagnosis not present

## 2023-10-19 DIAGNOSIS — E1165 Type 2 diabetes mellitus with hyperglycemia: Secondary | ICD-10-CM | POA: Diagnosis not present

## 2023-10-19 DIAGNOSIS — I10 Essential (primary) hypertension: Secondary | ICD-10-CM | POA: Diagnosis not present

## 2023-11-06 DIAGNOSIS — N8189 Other female genital prolapse: Secondary | ICD-10-CM | POA: Diagnosis not present

## 2023-11-06 DIAGNOSIS — K5900 Constipation, unspecified: Secondary | ICD-10-CM | POA: Diagnosis not present

## 2023-12-24 ENCOUNTER — Encounter (INDEPENDENT_AMBULATORY_CARE_PROVIDER_SITE_OTHER): Payer: Medicare HMO | Admitting: Ophthalmology

## 2023-12-24 DIAGNOSIS — H43813 Vitreous degeneration, bilateral: Secondary | ICD-10-CM

## 2023-12-24 DIAGNOSIS — I10 Essential (primary) hypertension: Secondary | ICD-10-CM | POA: Diagnosis not present

## 2023-12-24 DIAGNOSIS — H35033 Hypertensive retinopathy, bilateral: Secondary | ICD-10-CM

## 2023-12-24 DIAGNOSIS — H34811 Central retinal vein occlusion, right eye, with macular edema: Secondary | ICD-10-CM

## 2024-01-21 DIAGNOSIS — K219 Gastro-esophageal reflux disease without esophagitis: Secondary | ICD-10-CM | POA: Diagnosis not present

## 2024-01-21 DIAGNOSIS — K5900 Constipation, unspecified: Secondary | ICD-10-CM | POA: Diagnosis not present

## 2024-01-21 DIAGNOSIS — Z299 Encounter for prophylactic measures, unspecified: Secondary | ICD-10-CM | POA: Diagnosis not present

## 2024-01-21 DIAGNOSIS — I10 Essential (primary) hypertension: Secondary | ICD-10-CM | POA: Diagnosis not present

## 2024-01-21 DIAGNOSIS — E1169 Type 2 diabetes mellitus with other specified complication: Secondary | ICD-10-CM | POA: Diagnosis not present

## 2024-03-17 ENCOUNTER — Encounter (INDEPENDENT_AMBULATORY_CARE_PROVIDER_SITE_OTHER): Payer: Medicare HMO | Admitting: Ophthalmology

## 2024-03-17 DIAGNOSIS — I10 Essential (primary) hypertension: Secondary | ICD-10-CM

## 2024-03-17 DIAGNOSIS — H35033 Hypertensive retinopathy, bilateral: Secondary | ICD-10-CM | POA: Diagnosis not present

## 2024-03-17 DIAGNOSIS — H43813 Vitreous degeneration, bilateral: Secondary | ICD-10-CM

## 2024-03-17 DIAGNOSIS — H34811 Central retinal vein occlusion, right eye, with macular edema: Secondary | ICD-10-CM | POA: Diagnosis not present

## 2024-04-29 DIAGNOSIS — I10 Essential (primary) hypertension: Secondary | ICD-10-CM | POA: Diagnosis not present

## 2024-04-29 DIAGNOSIS — E1165 Type 2 diabetes mellitus with hyperglycemia: Secondary | ICD-10-CM | POA: Diagnosis not present

## 2024-04-29 DIAGNOSIS — E039 Hypothyroidism, unspecified: Secondary | ICD-10-CM | POA: Diagnosis not present

## 2024-04-29 DIAGNOSIS — Z299 Encounter for prophylactic measures, unspecified: Secondary | ICD-10-CM | POA: Diagnosis not present

## 2024-04-29 DIAGNOSIS — E1169 Type 2 diabetes mellitus with other specified complication: Secondary | ICD-10-CM | POA: Diagnosis not present

## 2024-06-16 ENCOUNTER — Encounter (INDEPENDENT_AMBULATORY_CARE_PROVIDER_SITE_OTHER): Admitting: Ophthalmology

## 2024-06-16 DIAGNOSIS — H43813 Vitreous degeneration, bilateral: Secondary | ICD-10-CM | POA: Diagnosis not present

## 2024-06-16 DIAGNOSIS — I10 Essential (primary) hypertension: Secondary | ICD-10-CM

## 2024-06-16 DIAGNOSIS — H35033 Hypertensive retinopathy, bilateral: Secondary | ICD-10-CM

## 2024-06-16 DIAGNOSIS — H34811 Central retinal vein occlusion, right eye, with macular edema: Secondary | ICD-10-CM | POA: Diagnosis not present

## 2024-07-10 DIAGNOSIS — Z01419 Encounter for gynecological examination (general) (routine) without abnormal findings: Secondary | ICD-10-CM | POA: Diagnosis not present

## 2024-07-10 DIAGNOSIS — Z1231 Encounter for screening mammogram for malignant neoplasm of breast: Secondary | ICD-10-CM | POA: Diagnosis not present

## 2024-08-18 DIAGNOSIS — Z299 Encounter for prophylactic measures, unspecified: Secondary | ICD-10-CM | POA: Diagnosis not present

## 2024-08-18 DIAGNOSIS — Z23 Encounter for immunization: Secondary | ICD-10-CM | POA: Diagnosis not present

## 2024-08-18 DIAGNOSIS — E119 Type 2 diabetes mellitus without complications: Secondary | ICD-10-CM | POA: Diagnosis not present

## 2024-08-18 DIAGNOSIS — F32A Depression, unspecified: Secondary | ICD-10-CM | POA: Diagnosis not present

## 2024-08-18 DIAGNOSIS — I10 Essential (primary) hypertension: Secondary | ICD-10-CM | POA: Diagnosis not present

## 2024-08-18 DIAGNOSIS — R32 Unspecified urinary incontinence: Secondary | ICD-10-CM | POA: Diagnosis not present

## 2024-09-22 ENCOUNTER — Encounter (INDEPENDENT_AMBULATORY_CARE_PROVIDER_SITE_OTHER): Admitting: Ophthalmology

## 2024-09-22 DIAGNOSIS — I10 Essential (primary) hypertension: Secondary | ICD-10-CM

## 2024-09-22 DIAGNOSIS — H34811 Central retinal vein occlusion, right eye, with macular edema: Secondary | ICD-10-CM | POA: Diagnosis not present

## 2024-09-22 DIAGNOSIS — H43813 Vitreous degeneration, bilateral: Secondary | ICD-10-CM

## 2024-09-22 DIAGNOSIS — H35033 Hypertensive retinopathy, bilateral: Secondary | ICD-10-CM | POA: Diagnosis not present

## 2024-11-21 ENCOUNTER — Ambulatory Visit (HOSPITAL_COMMUNITY)
Admission: RE | Admit: 2024-11-21 | Discharge: 2024-11-21 | Disposition: A | Source: Ambulatory Visit | Attending: Family Medicine | Admitting: Family Medicine

## 2024-11-21 ENCOUNTER — Other Ambulatory Visit (HOSPITAL_COMMUNITY): Payer: Self-pay | Admitting: Family Medicine

## 2024-11-21 DIAGNOSIS — M25551 Pain in right hip: Secondary | ICD-10-CM

## 2024-12-22 ENCOUNTER — Encounter (INDEPENDENT_AMBULATORY_CARE_PROVIDER_SITE_OTHER): Admitting: Ophthalmology
